# Patient Record
Sex: Female | Born: 1985
Health system: Southern US, Community
[De-identification: ages and names within clinical notes are randomized; demographics above are authoritative.]

## PROBLEM LIST (undated history)

## (undated) DIAGNOSIS — O9902 Anemia complicating childbirth: Secondary | ICD-10-CM

## (undated) DIAGNOSIS — I509 Heart failure, unspecified: Secondary | ICD-10-CM

## (undated) DIAGNOSIS — Z87440 Personal history of urinary (tract) infections: Secondary | ICD-10-CM

## (undated) DIAGNOSIS — O1490 Unspecified pre-eclampsia, unspecified trimester: Secondary | ICD-10-CM

## (undated) DIAGNOSIS — D696 Thrombocytopenia, unspecified: Secondary | ICD-10-CM

## (undated) DIAGNOSIS — B019 Varicella without complication: Secondary | ICD-10-CM

## (undated) HISTORY — DX: Personal history of urinary (tract) infections: Z87.440

## (undated) HISTORY — DX: Varicella without complication: B01.9

---
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
## 2003-09-14 ENCOUNTER — Other Ambulatory Visit: Admission: RE | Admit: 2003-09-14 | Discharge: 2003-09-14 | Payer: Self-pay | Admitting: Obstetrics and Gynecology

## 2005-08-10 ENCOUNTER — Encounter: Admission: RE | Admit: 2005-08-10 | Discharge: 2005-08-10 | Payer: Self-pay | Admitting: Internal Medicine

## 2005-10-22 ENCOUNTER — Ambulatory Visit: Payer: Self-pay | Admitting: Pulmonary Disease

## 2005-10-22 ENCOUNTER — Ambulatory Visit: Admission: RE | Admit: 2005-10-22 | Discharge: 2005-10-22 | Payer: Self-pay | Admitting: Cardiology

## 2007-07-05 LAB — HM HEPATITIS C SCREENING LAB: HM Hepatitis Screen: NEGATIVE

## 2007-07-17 DIAGNOSIS — I509 Heart failure, unspecified: Secondary | ICD-10-CM

## 2007-07-17 DIAGNOSIS — O1490 Unspecified pre-eclampsia, unspecified trimester: Secondary | ICD-10-CM

## 2007-07-17 HISTORY — DX: Unspecified pre-eclampsia, unspecified trimester: O14.90

## 2007-07-17 HISTORY — DX: Heart failure, unspecified: I50.9

## 2008-05-21 ENCOUNTER — Inpatient Hospital Stay (HOSPITAL_COMMUNITY): Admission: AD | Admit: 2008-05-21 | Discharge: 2008-05-21 | Payer: Self-pay | Admitting: Obstetrics and Gynecology

## 2008-05-21 ENCOUNTER — Ambulatory Visit: Payer: Self-pay | Admitting: Hematology & Oncology

## 2008-05-22 ENCOUNTER — Inpatient Hospital Stay (HOSPITAL_COMMUNITY): Admission: AD | Admit: 2008-05-22 | Discharge: 2008-05-22 | Payer: Self-pay | Admitting: Obstetrics and Gynecology

## 2008-05-27 ENCOUNTER — Inpatient Hospital Stay (HOSPITAL_COMMUNITY): Admission: AD | Admit: 2008-05-27 | Discharge: 2008-06-02 | Payer: Self-pay | Admitting: Obstetrics and Gynecology

## 2008-05-27 ENCOUNTER — Ambulatory Visit: Payer: Self-pay | Admitting: Internal Medicine

## 2008-05-29 ENCOUNTER — Encounter: Payer: Self-pay | Admitting: Internal Medicine

## 2008-05-29 ENCOUNTER — Encounter (INDEPENDENT_AMBULATORY_CARE_PROVIDER_SITE_OTHER): Payer: Self-pay | Admitting: Obstetrics and Gynecology

## 2008-05-29 ENCOUNTER — Ambulatory Visit: Payer: Self-pay | Admitting: Hematology & Oncology

## 2008-06-09 ENCOUNTER — Encounter: Admission: RE | Admit: 2008-06-09 | Discharge: 2008-07-08 | Payer: Self-pay | Admitting: Obstetrics and Gynecology

## 2008-07-09 ENCOUNTER — Encounter: Admission: RE | Admit: 2008-07-09 | Discharge: 2008-07-12 | Payer: Self-pay | Admitting: Obstetrics and Gynecology

## 2008-11-18 ENCOUNTER — Ambulatory Visit (HOSPITAL_COMMUNITY): Admission: RE | Admit: 2008-11-18 | Discharge: 2008-11-18 | Payer: Self-pay | Admitting: Obstetrics and Gynecology

## 2009-09-04 IMAGING — CR DG CHEST 2V
2 series · 2 of 2 positions shown · non-contrast
Comparison: Chest CT 05/29/2008

CLINICAL DATA: Preterm labor.  Chest pain.  Pulmonary edema.

CHEST - 2 VIEW

[view not recorded (1 of 2)]
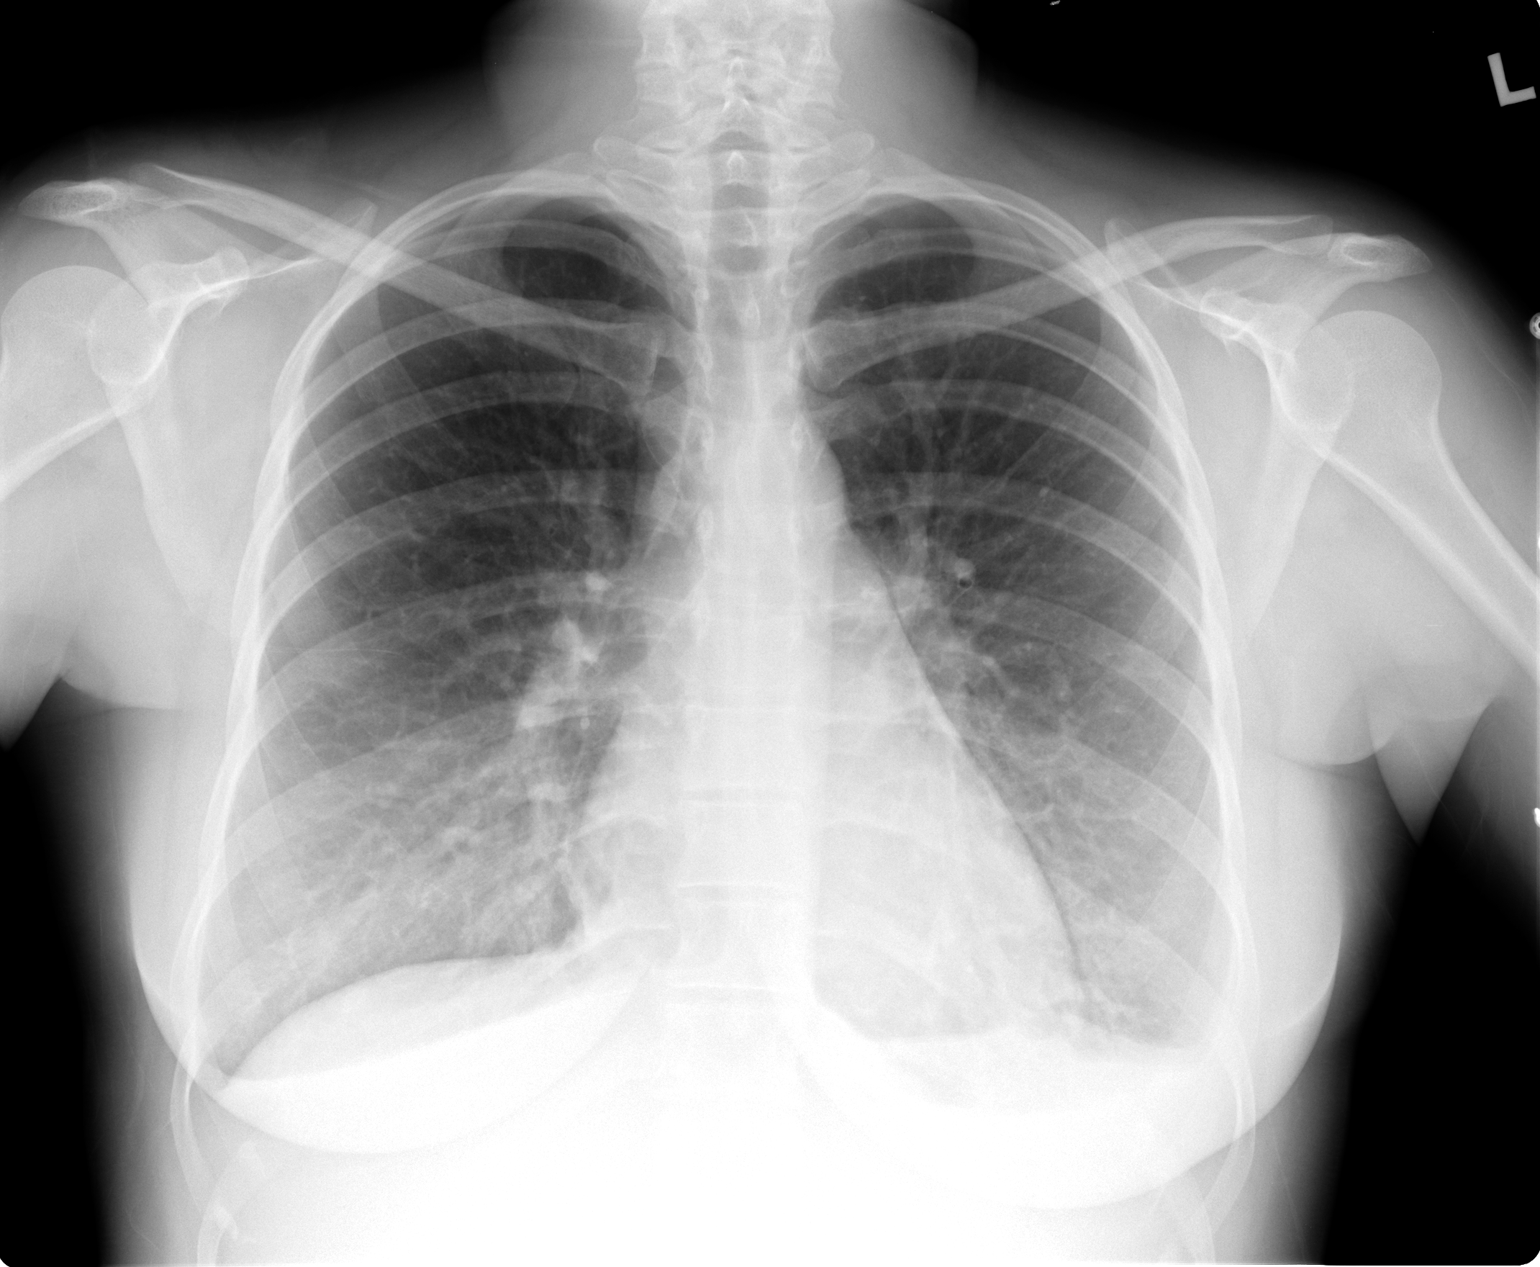

[view not recorded (2 of 2)]
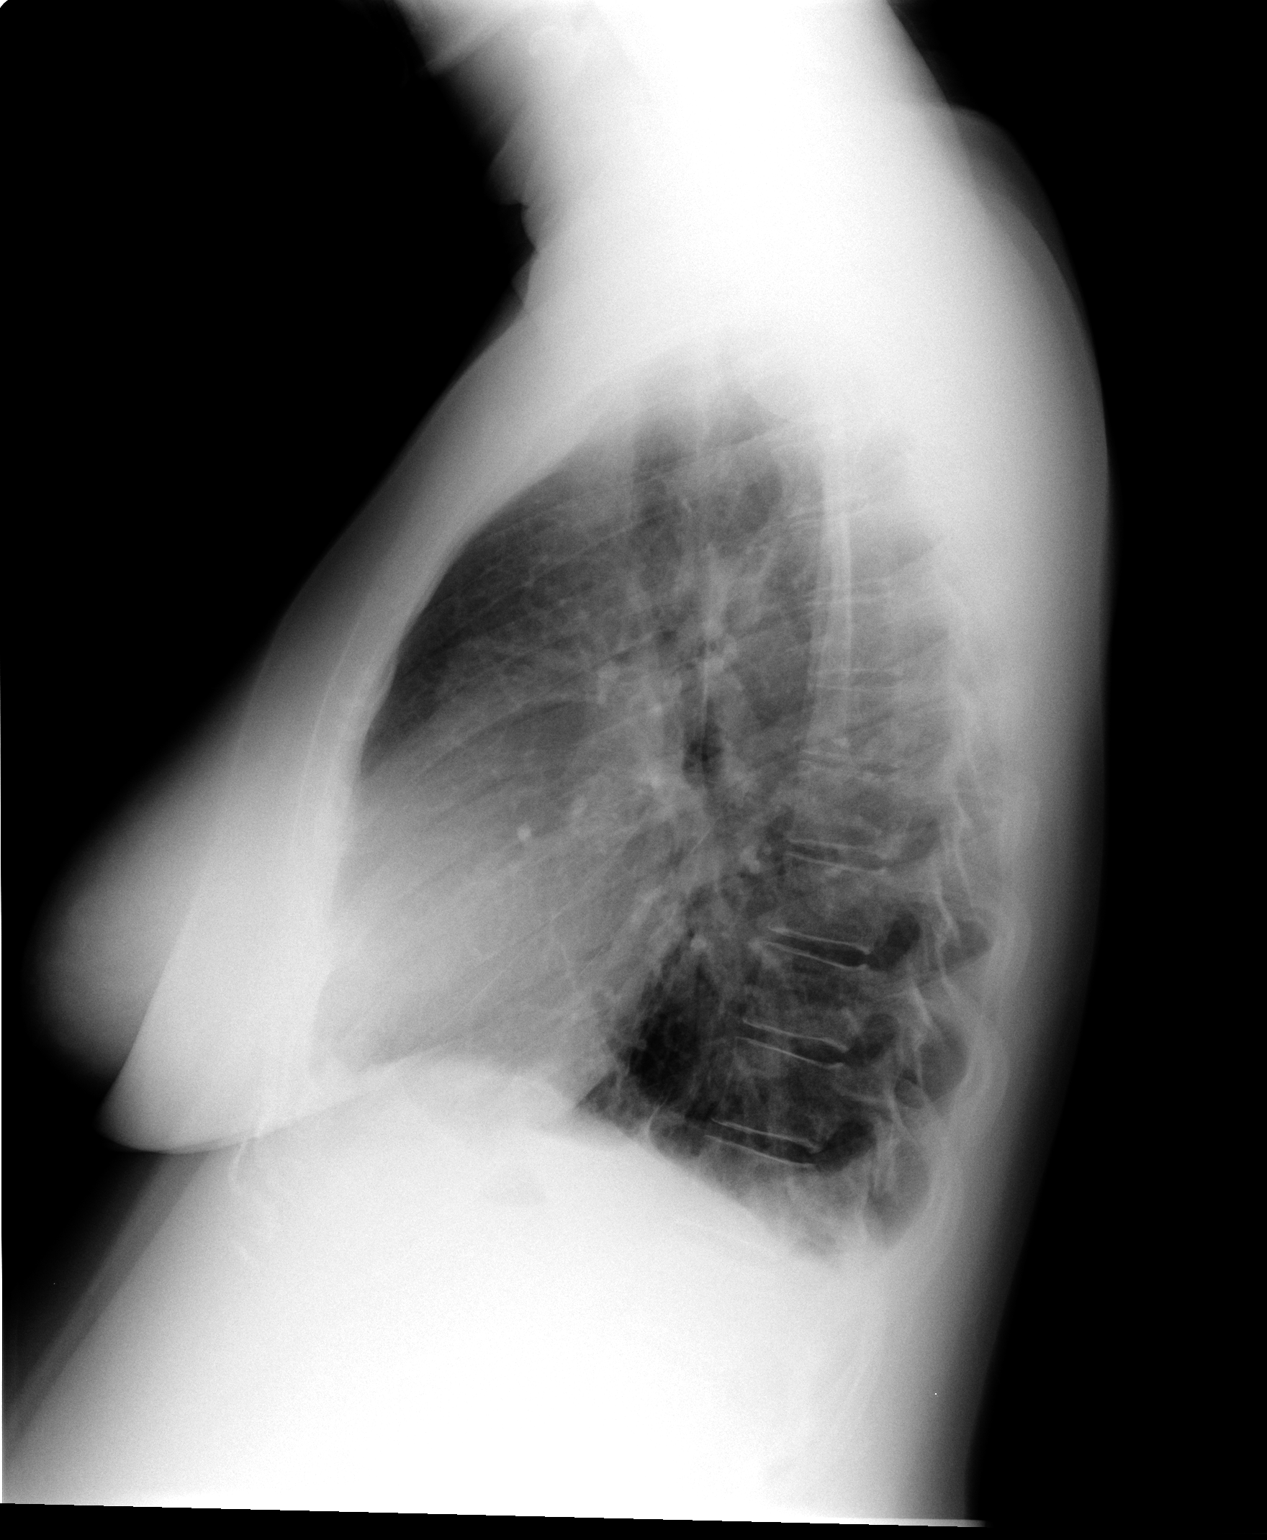

[2 of 2 positions shown; findings below may reference images not displayed]

FINDINGS: Small bilateral pleural effusions are present, left
greater than right.  Bibasilar atelectasis is associated with the
effusions.  Mild bibasilar interstitial pulmonary edema is present.
Compared to the prior exam of 05/29/2008, the left pleural effusion
appears slightly larger however the interstitial edema appears
minimally improved.  No focal airspace consolidation.
Cardiopericardial silhouette within normal limits for projection.
IMPRESSION: 1.  Minimally improved interstitial pulmonary edema.
2.  Small bilateral pleural effusions, left greater than right.

## 2010-09-06 ENCOUNTER — Other Ambulatory Visit: Payer: Self-pay | Admitting: Obstetrics and Gynecology

## 2010-09-21 ENCOUNTER — Other Ambulatory Visit: Payer: Self-pay | Admitting: Obstetrics and Gynecology

## 2010-10-12 ENCOUNTER — Ambulatory Visit: Payer: 59 | Attending: Gynecologic Oncology | Admitting: Gynecologic Oncology

## 2010-10-19 ENCOUNTER — Ambulatory Visit: Payer: 59 | Attending: Gynecologic Oncology | Admitting: Gynecologic Oncology

## 2010-10-19 DIAGNOSIS — D280 Benign neoplasm of vulva: Secondary | ICD-10-CM | POA: Insufficient documentation

## 2010-11-03 NOTE — Consult Note (Signed)
  NAMETERA, PELLICANE              ACCOUNT NO.:  [id number]  MEDICAL RECORD NO.:  [id number]           PATIENT TYPE:  LOCATION:                                 FACILITY:  PHYSICIAN:  Laurette Schimke, MD     DATE OF BIRTH:  1986-03-27  DATE OF CONSULTATION:  10/19/2010 DATE OF DISCHARGE:  10/19/2010                                CONSULTATION   Visit #[id number].  REASON FOR VISIT:  Atypical melanocytic nevi of the genital type.  HISTORY OF PRESENT ILLNESS:  This is a 25 year old gravida 1, para 1, who noted the presence of a vulva lesion at her postpartum visit in November 2011.  She notes identification of pigmented-appearing lesion on her labia.  She denies any pruritus or bleeding or abnormal coloration associated with the lesion.  She underwent a punch biopsy of 2 lesions, one of the right labia minora and a second perineal biopsy. The peroneal biopsy returned with atypical melanocytic proliferation of genital origin.  That biopsy measured 6 x 4 x 3 mm with margin involvement.  This biopsy was obtained on September 21, 2010, and required suture to control bleeding.  PAST MEDICAL HISTORY:  Denies past GYN history, pregnancy complicated by preeclampsia with congestive heart failure.  PAST SURGICAL HISTORY:  Cesarean section in November 2011.  Tobacco use denies, reports approximately 2-3 beers over the weekend. 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  ALLERGIES:  PENICILLIN administered in childhood that caused a rash.  REVIEW OF SYSTEMS:  No nausea, vomiting, fever, chills.  She reports a history of tanning bed use, last use in February 2011.  No flank pain. No adenopathy.  No other worrisome nevi at current.  Otherwise, 10-point review of systems negative.  PHYSICAL EXAMINATION:  GENERAL:  Well-developed, thin female, in no acute distress. CHEST:  Clear to auscultation. LYMPH NODE SURVEY:  No cervical, subclavicular, or inguinal adenopathy. PELVIC:  Normal external  genitalia, Bartholin's, urethra, and Skene's. The biopsy site in the perineum is identifiable only because of nodularity of the suture, no gross pigmented lesions are appreciated.  The diagnosis of genital area atypical genital nevi were discussed with the patient.  The options presented to her given the positive margins or re-excision or careful followup.  The patient opts for re-excision. This procedure, although very small will likely occur in the operating room given the patient's reluctance to have been off this procedure because of the bleeding at the last visit, as such she recontacted regarding a surgical date for re-excision.     Laurette Schimke, MD     WB/MEDQ  D:  10/19/2010  T:  10/20/2010  Job:  [id number]  cc:   Maxie Better, M.D. Fax: [phone]  Telford Nab, R.N. [address] [place], [place] [address]  Electronically Signed by Laurette Schimke MD on 11/03/2010 09:43:33 AM

## 2010-11-28 NOTE — Discharge Summary (Signed)
[name], [name]              ACCOUNT NO.:  [id number]   MEDICAL RECORD NO.:  [id number]          PATIENT TYPE:  INP   LOCATION:  9313                          [facility]   PHYSICIAN:  Maxie Better, M.D.DATE OF BIRTH:  [birth year]   DATE OF ADMISSION:  05/27/2008  DATE OF DISCHARGE:  06/02/2008                               DISCHARGE SUMMARY   ADMISSION DIAGNOSES:  Severe preeclampsia, intrauterine gestation at 31  weeks.   DISCHARGE DIAGNOSES:  Severe preeclampsia, intrauterine gestation at 27  and 1/7 weeks delivered, pulmonary edema.   PROCEDURE:  Primary cesarean section per hysterotomy.   HISTORY OF PRESENT ILLNESS:  A 25 year old gravida 1, para 0 female at  5 weeks admitted with elevated blood pressure and low platelets.  The  patient had an uncomplicated prenatal course until the patient was noted  to have isolated thrombocytopenia.  At this time blood pressure was  normal.  The patient was thought to have gestational thrombocytopenia.  She subsequently had elevation of her fluid in the legs for which PIH  studies were done which were normal.  She also had an episode of severe  right upper quadrant pain for which abdominal ultrasound was negative.  PIH labs again were normal.  Blood pressure remained normal.  The  patient was brought out of work due to peripheral edema and had  resolution of her edema.  Platelet counts remained low.  On May 27, 2008, the patient presented with elevation of her blood pressure and she  was admitted.  The patient received betamethasone after she presented  with her second episode of abnormal complaint.   HOSPITAL COURSE:  The patient was admitted on May 27, 2008, with  blood pressure of 160/110.  She received IV labetalol.  PIH studies were  obtained.  Her spot urine had shown 1+ protein and a 24 hour urine  collection was started.  Her platelet count was down to 82,000.  Uric  acid was elevated at 6.8.  She was  admitted as severe pregnancy induced  hypertension and probable preeclampsia with worsening, tracing was  reactive, no decels, no contractions.  NICU consultation was obtained.  The patient was started on labetalol 200 mg p.o. b.i.d. and repeat PIH  studies were planned.  On May 28, 2008, her hemoglobin was 9.6,  hematocrit 28.1, platelet count of 92,000.  Her liver enzymes were still  normal.  Her uric acid 7.3.  She had 1+ edema and deep tendon reflexes  1+.  Magnesium sulfate had been started on admission.  The patient on  hospital day #3 serum magnesium sulfate that had been started completed,  was complaining of shoulder and neck pain and right arm weakness.  She  had some right upper quadrant pain and soreness but not as bad as her  initial presentation a few days previously.  Some facial edema and hand  swelling.  Her blood pressures ranged 121-131 over 85-94.  Blood  pressure at that time was 130/95.  She was afebrile.  Her lungs were  clear.  There was no wheezing.  The patient reported having  difficulty  lying flat.  Her urine total protein came back as 856 at 5 mg of  protein.  Her platelet count was 88,000, magnesium level pending and she  was 2+ liters.  Given her shortness of breath, neck pain/right upper  quadrant pain the patient had a chest x-ray performed, chest x-ray to  rule out pulmonary edema, acute congestive heart failure was noted.  CT  scan was obtained to rule out pulmonary result and magnesium sulfate was  discontinued.  Hematology/oncology group consult was obtained.  Magnesium level was 6.5 after being discontinued.  CT scan was negative  for any PE.  Borderline cardiomegaly was noted.  Cardiology consultation  and MFM consultation was also obtained.  Echocardiogram was done and was  negative.  Lasix was given.  Maternal fetal medicine input suggested  that this was severe preeclampsia and the patient needed to be  delivered.  Therefore, the patient was  taken to the operating room and  underwent a primary cesarean section per hysterotomy, live female 3  pounds 9 ounces, Apgars of 7 and 7.  Postoperatively she was placed in  intensive care unit.  Magnesium was held due to the elevation of level  and pulmonary edema previously.  Blood pressure medications were  continued.  The patient received several rounds of Lasix.  Hydrochlorothiazide was added to her labetalol regimen.  Blood pressures  were improving on the regimen.  She was 3 liters diuresis.  Repeat chest  x-ray still showed some pulmonary edema but the patient clinically could  lie flat and was feeling much better.  The patient subsequently was able  to be transferred to the postpartum floor.  She was continued on her  hydrochlorothiazide and labetalol and on postoperative day #4 the  patient was able to be discharged home.   DISPOSITION:  Home.   CONDITION:  Stable.   DISCHARGE MEDICATIONS:  1. Labetalol 200 mg p.o. b.i.d.  2. Hydrochlorothiazide 50 mg p.o. daily.  3. Ambien 5 mg p.o. at bedtime.  4. Darvocet-N 100 one tab every 4 hours as needed.   FOLLOWUP:  At [facility] ob/gyn in 1 week for blood pressure evaluation.   DISCHARGE INSTRUCTIONS:  Per the postpartum booklet given and per PIH  warning signs.      Maxie Better, M.D.  Electronically Signed     [place]/MEDQ  D:  07/11/2008  T:  07/11/2008  Job:  [id number]

## 2010-11-28 NOTE — Op Note (Signed)
[name], [name]              ACCOUNT NO.:  [id number]   MEDICAL RECORD NO.:  [id number]          PATIENT TYPE:  INP   LOCATION:  9374                          [facility]   PHYSICIAN:  Maxie Better, M.D.DATE OF BIRTH:  07-Oct-1985   DATE OF PROCEDURE:  05/29/2008  DATE OF DISCHARGE:                               OPERATIVE REPORT   PREOPERATIVE DIAGNOSES:  1. Severe preeclampsia.  2. Pulmonary edema.  3. Intrauterine gestation at 31-1/7 weeks.   POSTOPERATIVE DIAGNOSES:  1. Severe preeclampsia.  2. Pulmonary edema.  3. Intrauterine gestation at 31-1/7 weeks.   PROCEDURE:  Primary cesarean section, Kerr hysterotomy.   ANESTHESIA:  Spinal x2.   SURGEON:  Maxie Better, MD   ASSISTANT:  None.   INDICATION:  A 25 year old gravida 1, para 0 female at 31-1/7 weeks with  betamethasone complete, who was admitted with severe preeclampsia on  May 27, 2008, and developed acute pulmonary edema with a normal  maternal echo being found and an unstable cervix, now being taken to the  operating room for primary cesarean section.  Surgical risks were  reviewed with the patient.  The patient received 40 mg of Lasix prior to  being transferred to the operating room.   PROCEDURE IN DETAIL:  Under adequate spinal anesthesia x2, the patient  was placed in the supine position with a left lateral tilt.  She was  sterilely prepped and draped in the usual fashion.  An indwelling Foley  catheter was already in place.  Slight tinge on the urine was  transiently noted for blood.  Marcaine 0.25% was injected along the  planned Pfannenstiel skin incision site.  Pfannenstiel skin incision was  then made, carried down to the rectus fascia.  Rectus fascia was opened  transversely.  Rectus fascia then was bluntly and sharply dissected off  the rectus muscle in a superior and inferior fashion.  The rectus  muscles were split in the midline.  The parietal peritoneum was entered  sharply and extended.  Vesicouterine peritoneum was opened transversely.  The bladder bluntly dissected off the lower uterine segment.  A  curvilinear low transverse uterine incision was then made and extended  with bandage scissors.  Artificial rupture of membranes were performed.  Clear amniotic fluid was noted.  Placenta was noted to be anterior.  Subsequent delivery of a live female was accomplished.  Baby was bulb  suctioned in the abdomen.  Cord was clamped and cut.  The baby was  transferred to the awaiting pediatrician, who subsequently assigned the  baby Apgars of 7 and 7 at 1 and 5 minutes.  The placenta was manually  removed.  The uterine cavity was cleaned of debris.  Uterus was  exteriorized.  The uterine cavity was cleaned again.  The uterine  incision had no extension and was closed in 2 layers, the first layer  with 0 Monocryl in a running locked stitch and the second layer was  imbricated using a 0 Monocryl suture.  Two additional sutures were  placed along the incision line for good hemostasis.  Normal tubes and  ovaries were noted  bilaterally.  The uterus was returned to the abdomen.  The abdomen  was copiously irrigated, suctioned of debris and clots.  The reinspection of the incision showed good hemostasis.  The parietal  peritoneum was closed with 2-0 Vicryl.  The rectus fascia was again  carefully inspected for any bleeders and cauterization was done if  found.  The rectus fascia was closed with 0 Vicryl x2.  The subcutaneous  areas were irrigated, small bleeders cauterized, and interrupted 2-0  plain sutures were placed in the skin approximately using Ethicon  staples.   SPECIMENS:  Placenta sent to Pathology.   ESTIMATED BLOOD LOSS:  1 L.   URINE OUTPUT:  500 mL.   INTRAOPERATIVE FLUID:  1200 mL.   Sponge and instrument counts x2 was correct.   COMPLICATIONS:  None.   Weight of the baby was 3 pounds and 9 ounces.  The baby was transferred  to the NICU  secondary to prematurity.  The patient tolerated the  procedure well and was transferred to recovery room in stable condition  and will be admitted to the Intensive Care Unit for further management.      Maxie Better, M.D.  Electronically Signed     [place]/MEDQ  D:  05/29/2008  T:  05/29/2008  Job:  [id number]

## 2010-11-28 NOTE — Consult Note (Signed)
[name], [name]              ACCOUNT NO.:  [id number]   MEDICAL RECORD NO.:  [id number]          PATIENT TYPE:  INP   LOCATION:  9374                          [facility]   PHYSICIAN:  Rose Phi. Myna Hidalgo, M.D. DATE OF BIRTH:  [birth year]   DATE OF CONSULTATION:  05/29/2008  DATE OF DISCHARGE:                                 CONSULTATION   REFERRING PHYSICIAN:  Maxie Better, MD   REASON FOR CONSULTATION:  1. Thrombocytopenia.  2. Third trimester pregnancy.  3. Pregnancy-induced hypertension.   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  This is her  first pregnancy.  She has been doing very well until recently.   She was admitted on May 27, 2008, because of PIH.  Her blood  pressure was 160/110.  She was noted to have a platelet count at that  time was 82.   She has had blood work done in the past according to Dr. Cherly Hensen.  Apparently, the platelet count in the past was normal.   She has not had any bleeding.  There has been no rashes.  She had no  bruising.  She does complain of some pain in the right neck.  This  occurred all of a sudden.  There may be some slight shortness of breath.  She denies any obvious chest pain or pleuritic pain.  There has been no  abdominal discomfort outside of that associated with the pregnancy.   There has been no hematuria.  Previously, had no diarrhea or  constipation.  There has been some slight swelling in the legs.   When she was [name] back on the May 21, 2008, platelet count was 112.   On May 27, 2008, her CBC showed a white count of 9.1, hemoglobin  10.5, hematocrit 30.9, and platelet count 82.  MCV was 91.  She had a  normal LDH.  BUN and creatinine were 8 and 0.6.  She had normal LFTs.  Total protein and albumin were decreased.   On the May 28, 2008, platelet count was 92,000 still.  Her  hemoglobin was down just a  little bit at 9.6.  She again had normal LDH  and BUN and creatinine.   Today, a CBC was drawn.  It showed a white count of 7.6, hemoglobin  10.1, hematocrit 30.3, and platelet count 88.  MCV was 91.  Her  metabolic panel shows sodium 137, potassium 3.0, BUN 7, and creatinine  6.  She had a total protein of 5.2 and albumin of 2.2.  She had a normal  LDH.  She had normal uric acid.  She has been on magnesium.   She did have a chest x-ray done today.  This showed acute congestive  heart failure with borderline cardiomegaly.  She also went out for a CT  scan.  The report I do not have back yet now.   She denies any fever.  There has been no headache.  Again she does state  there is some discomfort  in the right side of her neck.  This seems to  go down her right arm.  She has just some slight weakness in her right  hand.   Her past medical history is relatively unremarkable.   Her allergies are to PENICILLIN.   ADMISSION MEDICATIONS:  Prenatal vitamins.  She currently is on Zantac  150 mg p.o. b.i.d.   SOCIAL HISTORY:  Negative for tobacco or alcohol use.  She has no  obvious occupational exposures.   Her review of systems is stated in the history of present illness.  No  obvious changes were noted on a 12-system review.   PHYSICAL EXAMINATION:  GENERAL:  This is a well-developed, well-  nourished white female who is pregnant.  She is alert and oriented x3.  VITAL SIGNS:  Her temperature of 98, pulse is 78, respiratory rate 18.  I do not have her blood pressure.  HEAD/NECK:  Normocephalic and atraumatic skull.  Pupils react  appropriately.  She has no oral lesions.  She has no adenopathy in her  neck.  Thyroid is not palpable.  LUNGS:  Clear bilaterally.  There are no rales, wheezes, or rhonchi.  CARDIAC:  Regular rate and rhythm with a normal S1 and S2.  There are no  murmurs, rubs, or bruits.  ABDOMEN:  Soft with good bowel sounds.  There is no palpable abdominal  mass.  She  is pregnant.  I do not detect any palpable  hepatosplenomegaly.  BACK:  No tenderness of the spine, ribs, or hips.  EXTREMITIES:  Some trace nonpitting edema in the lower legs.  She has  good range of motion of her joints.  She has some very slight decrease  in right arm strength and grip.  Her right shoulder strength is adequate  and symmetric to her left shoulder.  SKIN:  No rashes, ecchymoses, or petechiae.  NEUROLOGIC:  No focal neurological deficits outside of the slight right  hand/arm weakness.   Her peripheral blood smear shows a normochromic normocytic population of  red blood cells.  There are no nucleated red blood cells.  I see no  teardrop cells.  She had no rouleaux formation.  I see no schistocytes.  White cells appear normal in morphology and  maturation.  There are no  immature myeloid or lymphoid forms.  Platelets are minimally decreased  in number.  She has numerous large platelets.  Platelets are well  granulated.   [name] is a charming 25 year old white female with thrombocytopenia.  This is very mild thrombocytopenia.  She is in third trimester  pregnancy.  She now has PIH.   One would have to believe that the platelet count is correlating with  the PIH.  This certainly appears to be gestational thrombocytopenia.  One would have to suspect that the placenta is acting as a sponge and  sequestering her platelets.   I certainly do not see any obvious hematologic malignancy.  I do not see  any evidence to suggest a microangiopathic hemolytic process such as TTP  or HUS.  I do not see any evidence to suggest ITP.  One would think ITP  would come on quite earlier in pregnancy and also her platelet count  would be a lot lower.  There is nothing to suggest B12 deficiency.  I do  not suspect any type of collagen vascular disease.   One would think that lupus anticoagulant or anticardiolipin antibody  would be a little unlikely causing such issues at this  stage of  the  pregnancy, but this certainly cannot be totally overlooked.   I believe that [name] has a minimally increased risk of bleeding  with her current platelet count.  Her large platelets are however very  functional and sticky, so I believe that hemostasis should not pose an  issue if she has go to a surgery or if she has a natural delivery.   I certainly do not see need for a bone marrow biopsy with [name]  right now.   I think that the only option to improve her platelet count is to have  her deliver.  I believe that an epidural would be safe, if her platelet  count got down to 70,000.   I would not transfuse her unless she is bleeding.   I do not see need for any IVIG or steroids for [name] as I do not  believe that this is an autoimmune thrombocytopenia process.   It would be interesting to see what her chest CT shows.  I would not  suspect pulmonary embolism, but one could postulate this.   As far as the right hand/arm weakness, this is mild.  It is hard to say  if this is a new problem.  She certainly is not too encumbered by it.  I  certainly would recommend close followup with the right hand and arm.  It is possible that she may need to have an MRI of the brain and make  sure that there is no cerebrovascular event going on.   I will certainly follow along and help out in anyway that I can.      Rose Phi. Myna Hidalgo, M.D.  Electronically Signed     PRE/MEDQ  D:  05/29/2008  T:  05/30/2008  Job:  [id number]   cc:   Maxie Better, M.D.  Fax: [phone]

## 2010-11-28 NOTE — Consult Note (Signed)
NAMEMACEY, WURTZ              ACCOUNT NO.:  [id number]   MEDICAL RECORD NO.:  [id number]          PATIENT TYPE:  INP   LOCATION:  9196                          [facility]   PHYSICIAN:  Pricilla Riffle, MD, FACCDATE OF BIRTH:  1986/02/01   DATE OF CONSULTATION:  05/29/2008  DATE OF DISCHARGE:                                 CONSULTATION   REASON FOR CONSULTATION:  Ms. Rogelio Seen is a 25 year old who we are asked  to see regarding CHF.   HISTORY OF PRESENT ILLNESS:  The patient has no known history of cardiac  problems.  She is now [redacted] weeks gestation.  About a week ago she noted  lower extremity swelling and also developed hypertension, decreased  platelets.  On Monday of this week she noted some shortness of breath  and on Thursday developed right shoulder pain, right arm pain/weakness.  She was admitted on Thursday evening with headache, shoulder pain  concern for preeclampsia/eclampsia.  She was treated with labetalol and  IV magnesium.  Today noted increased shortness of breath, chest x-ray  consistent with CHF.  Currently without a headache, notes some shortness  of breath.  Note, she just received 40 of Lasix intravenously   ALLERGIES:  PENICILLIN.   PAST MEDICAL HISTORY:  Negative.  Note, she did have a cardiopulmonary  stress test in May 2007 that showed mild impairment consistent with  deconditioning probably.   FAMILY HISTORY:  No history of premature CAD or CHF.   SOCIAL HISTORY:  The patient works on [address] at [employer].  Does  not smoke.   MEDICATIONS:  1. Currently include Zantac 150 b.i.d.  2. Lasix 140 mg.  3. Labetalol 200 b.i.d.  4. Magnesium discontinued earlier.  5. Niferex.   REVIEW OF SYSTEMS:  Nonproductive cough, otherwise all systems reviewed  as to the above problem except as noted above.   PHYSICAL EXAM:  GENERAL:  On exam the patient is in no acute distress at  rest.  VITAL SIGNS:  Blood pressure is 148/103, pulse is 90s, respiratory  rate  is 18, temperature is 97.9.  HEENT:  Normocephalic, atraumatic.  EOMI.  PERRL.  Mucous membranes are  moist.  NECK:  Increased JVP.  No thyromegaly or bruits.  LUNGS:  Rales 1/3 up bilaterally.  CARDIAC:  Regular rate and rhythm.  S1-S2.  No S3-S4 or murmurs.  ABDOMEN:  Abdomen is distended, mild right upper quadrant tenderness.  EXTREMITIES:  Warm, 2+ pulses distally.  Trace lower extremity edema.   LABS:  Significant for a hemoglobin of 10, WBC of 7.6, platelets of  88,000, BUN and creatinine of 7 and 0.6, potassium of 3.8.  AST, ALT of  19 and 12.  Chest x-ray again consistent with pulmonary edema.  CT of  the chest shows no pulmonary embolus, CHF, noncalcified granulomata  noted, minimal patchy pneumonia versus focal inflammation.   Echocardiogram preliminary shows normal LV RV size, normal LV function  with an LVEF of 60-65%, trace MR, trace TR.   IMPRESSION:  The patient is a 25 year old G1, P0 now [redacted] weeks pregnant.  She  is hypertensive, short of breath during presentation consistent with  pulmonary edema responding well to IV Lasix with brisk diuresis.  Echo  with normal LV RV function.  Overall above is consistent with  preeclampsia.  No plans for C-section as noted.  From a cardiac  standpoint she should tolerate well.  Can dose Lasix as needed by  clinical status.  Continue labetalol.  Will continue to follow.      Pricilla Riffle, MD, [facility]  Electronically Signed     [place]/MEDQ  D:  05/29/2008  T:  05/29/2008  Job:  [id number]

## 2010-12-04 ENCOUNTER — Other Ambulatory Visit: Payer: Self-pay | Admitting: Gynecologic Oncology

## 2010-12-04 ENCOUNTER — Encounter (HOSPITAL_COMMUNITY): Payer: 59

## 2010-12-04 LAB — SURGICAL PCR SCREEN: Staphylococcus aureus: NEGATIVE

## 2010-12-04 LAB — CBC
MCH: 28.2 pg (ref 26.0–34.0)
MCV: 87 fL (ref 78.0–100.0)
Platelets: 152 10*3/uL (ref 150–400)
RBC: 4.61 MIL/uL (ref 3.87–5.11)
RDW: 13.1 % (ref 11.5–15.5)

## 2010-12-05 ENCOUNTER — Other Ambulatory Visit: Payer: Self-pay | Admitting: Gynecologic Oncology

## 2010-12-05 ENCOUNTER — Ambulatory Visit (HOSPITAL_COMMUNITY)
Admission: RE | Admit: 2010-12-05 | Discharge: 2010-12-05 | Disposition: A | Discharge status: Home / Self Care | Payer: 59 | Source: Ambulatory Visit | Attending: Gynecologic Oncology | Admitting: Gynecologic Oncology

## 2010-12-05 DIAGNOSIS — Z01812 Encounter for preprocedural laboratory examination: Secondary | ICD-10-CM | POA: Insufficient documentation

## 2010-12-05 DIAGNOSIS — D280 Benign neoplasm of vulva: Secondary | ICD-10-CM | POA: Insufficient documentation

## 2010-12-18 NOTE — Op Note (Signed)
  [name], [name]              ACCOUNT NO.:  [id number]  MEDICAL RECORD NO.:  [id number]           PATIENT TYPE:  O  LOCATION:  DAYL                         FACILITY:  [facility]  PHYSICIAN:  Laurette Schimke, MD     DATE OF BIRTH:  09/04/1985  DATE OF PROCEDURE:  12/05/2010 DATE OF DISCHARGE:  12/05/2010                              OPERATIVE REPORT   PREOPERATIVE DIAGNOSIS:  Atypical melanocytic nevus of the vulva.  POSTOPERATIVE DIAGNOSIS:  Atypical melanocytic nevus of the vulva.  PROCEDURE:  Wide local excision 1 cm x 1 cm with the perineal block anesthesia, LMA with 1% lidocaine perineal block.  SURGEON:  Laurette Schimke, MD  ASSISTANT:  Roseanna Rainbow, M.D.  INDICATIONS FOR PROCEDURE:  This is a 25 year old with a lesion noted on the right aspect of the perineum, it was excised, and was noted to be an atypical melanocytic nevus with positive margins.  The patient was counseled regarding observation versus re-excision for negative margin and opted for re-excision.  DESCRIPTION OF PROCEDURE:  She was taken to the operating room and placed under anesthesia without any difficulty.  She was prepped and draped in usual sterile fashion.  The area of the prior resection was identifiable based on suture granuloma.  The perineal block was administered bilaterally with 1% lidocaine without epinephrine and 10 cc was injected on each side.  A 1 x 1  cm lesion was excised.  The defect was closed in 2 layers, the first approximated the subcutaneous tissue and the second interrupted 3-0 Vicryl sutures.  Hemostasis was ensured. Estimated blood loss minimal.  Sponge, instrument, and needle counts correct x3.     Laurette Schimke, MD     WB/MEDQ  D:  12/14/2010  T:  12/15/2010  Job:  [id number]  cc:   Telford Nab, R.N. [address] [place], [place] [address]  Electronically Signed by Laurette Schimke MD on 12/18/2010 09:54:10 AM

## 2010-12-21 ENCOUNTER — Ambulatory Visit: Payer: 59 | Attending: Gynecologic Oncology | Admitting: Gynecologic Oncology

## 2010-12-22 NOTE — Consult Note (Signed)
  NAMESAMENTHA, [name] NO.:  [id number]  MEDICAL RECORD NO.:  [id number]  LOCATION:  GYN                          FACILITY:  [facility]  PHYSICIAN:  Laurette Schimke, MD     DATE OF BIRTH:  1985/10/13  DATE OF CONSULTATION:  12/21/2010 DATE OF DISCHARGE:                                CONSULTATION   REASON FOR VISIT:  Wound check.  HISTORY OF PRESENT ILLNESS:  This is a 25 year old who noted a vulva lesion at time of her postpartum visit in November 2002.  Punch biopsy of a perineal lesion was notable for atypical melanocytic proliferation of genital origin with margin involvement.  On Dec 06, 2010, she underwent excision of the area and pathology was notable for residual atypical melanocytic proliferation of genital origin and margins were not involved.  The incision was closed in layers and the patient presented to the office on 2 occasions because of painful external suture. The external sutures were removed and she reports excellent comfort since.  REVIEW OF SYSTEMS:  No fever, chills, nausea, vomiting.  Wound edge separation of perineal excision.  PHYSICAL EXAMINATION:  GENERAL:  A well-developed female, in no acute distress. VITAL SIGNS:  Weight 126 pounds, blood pressure 118/64, pulse of 80. PELVIC:  Normal external genitalia, Bartholin's, urethra, and Skene's. Incision site with good granulation tissues.  Three interrupted sutures were appreciated.  These were removed.  Silver nitrate was placed to achieve hemostasis  IMPRESSION:  Atypical melanocytic proliferation of genital origin, status post wide local excision with negative margins.  [name] has been advised to continue excellent wound care.  She has been counseled regarding keeping the area clean.  I have asked her to follow up in 6 weeks.     Laurette Schimke, MD     WB/MEDQ  D:  12/21/2010  T:  12/22/2010  Job:  [id number]  cc:   Telford Nab, R.N. [address] [place], [place]  [address]  Maxie Better, M.D. Fax: [phone]  Electronically Signed by Laurette Schimke MD on 12/22/2010 10:34:03 AM

## 2011-03-01 ENCOUNTER — Ambulatory Visit: Payer: 59 | Attending: Gynecologic Oncology | Admitting: Gynecologic Oncology

## 2011-03-01 DIAGNOSIS — N9089 Other specified noninflammatory disorders of vulva and perineum: Secondary | ICD-10-CM | POA: Insufficient documentation

## 2011-03-06 NOTE — Consult Note (Signed)
  [name], [name] NO.:  [id number]  MEDICAL RECORD NO.:  [id number]  LOCATION:  GYN                          FACILITY:  [facility]  PHYSICIAN:  Laurette Schimke, MD     DATE OF BIRTH:  [birth year]  DATE OF CONSULTATION:  03/01/2011 DATE OF DISCHARGE:                                CONSULTATION   REASON FOR VISIT:  Atypical melanocytic proliferation of genital origin.  HISTORY OF PRESENT ILLNESS:  This is a 25 year old who was seen by Dr. Cherly Hensen and evaluated for an atypical-appearing vulvar lesion.  This was done at postpartum period in November 2012.  The punch biopsy was notable for atypical melanocytic proliferation of genital origin with margin involvement.  On evaluation after the biopsy, no gross residual disease was present.  The patient was given the options of surveillance versus re-excision in order to obtain negative margins.  She opted for the latter.  On Dec 06, 2010, she underwent an excision of the left inferior labia minora.  Final pathology was notable for residual atypical genital nevus with evidence of chronic inflammation and margins were unclear.  Postoperatively, there was skin separation and pain management.  These have all since resolved and the patient states she is doing very well.  REVIEW OF SYSTEMS:  No fever, chills, nausea, vomiting.  No pain.  No dyspareunia.  PHYSICAL EXAMINATION:  GENERAL:  Well-developed female, in no acute distress. VITAL SIGNS:  Weight 123 pounds, blood pressure 110/68, pulse of 88. PELVIC:  External genitalia with evidence of a scar in the inferior aspect of the right labia, otherwise no lesions appreciated.  IMPRESSION:  Atypical melanocytic proliferation of genital origin.  She is now status post wide local excision with negative margins.  I have asked [name] to follow up with Dr. Cherly Hensen.     Laurette Schimke, MD     WB/MEDQ  D:  03/01/2011  T:  03/02/2011  Job:  [id number]  cc:   Maxie Better, M.D. Fax: [phone]  Telford Nab, R.N. [address] [place], [place] [address]  Electronically Signed by Laurette Schimke MD on 03/06/2011 07:09:04 AM

## 2011-04-16 LAB — OB RESULTS CONSOLE ABO/RH: RH Type: POSITIVE

## 2011-04-16 LAB — OB RESULTS CONSOLE HIV ANTIBODY (ROUTINE TESTING): HIV: NONREACTIVE

## 2011-04-16 LAB — OB RESULTS CONSOLE ANTIBODY SCREEN: Antibody Screen: NEGATIVE

## 2011-04-16 LAB — OB RESULTS CONSOLE RUBELLA ANTIBODY, IGM: Rubella: IMMUNE

## 2011-04-17 LAB — COMPREHENSIVE METABOLIC PANEL
ALT: 10
ALT: 11
ALT: 11
ALT: 12
ALT: 9
AST: 17
AST: 18
AST: 19
AST: 19
AST: 20
AST: 27
Albumin: 2 — ABNORMAL LOW
Albumin: 2 — ABNORMAL LOW
Albumin: 2.1 — ABNORMAL LOW
Alkaline Phosphatase: 125 — ABNORMAL HIGH
Alkaline Phosphatase: 127 — ABNORMAL HIGH
Alkaline Phosphatase: 133 — ABNORMAL HIGH
Alkaline Phosphatase: 136 — ABNORMAL HIGH
Alkaline Phosphatase: 148 — ABNORMAL HIGH
BUN: 10
BUN: 8
CO2: 24
CO2: 24
CO2: 24
CO2: 27
CO2: 28
CO2: 29
Calcium: 6.8 — ABNORMAL LOW
Calcium: 7.3 — ABNORMAL LOW
Calcium: 7.5 — ABNORMAL LOW
Calcium: 7.8 — ABNORMAL LOW
Calcium: 8.3 — ABNORMAL LOW
Calcium: 8.3 — ABNORMAL LOW
Chloride: 100
Chloride: 103
Chloride: 104
Chloride: 106
Chloride: 106
Chloride: 107
Chloride: 107
Creatinine, Ser: 0.58
Creatinine, Ser: 0.65
Creatinine, Ser: 0.82
Creatinine, Ser: 1
GFR calc Af Amer: >60
GFR calc Af Amer: >60
GFR calc Af Amer: >60
GFR calc non Af Amer: >60
GFR calc non Af Amer: >60
GFR calc non Af Amer: >60
GFR calc non Af Amer: >60
GFR calc non Af Amer: >60
Glucose, Bld: 102 — ABNORMAL HIGH
Glucose, Bld: 102 — ABNORMAL HIGH
Glucose, Bld: 76
Glucose, Bld: 80
Glucose, Bld: 82
Glucose, Bld: 94
Potassium: 3.5
Potassium: 3.6
Potassium: 3.9
Potassium: 4.3
Sodium: 134 — ABNORMAL LOW
Sodium: 134 — ABNORMAL LOW
Sodium: 135
Sodium: 138
Total Bilirubin: 0.1 — ABNORMAL LOW
Total Bilirubin: 0.1 — ABNORMAL LOW
Total Bilirubin: 0.2 — ABNORMAL LOW
Total Bilirubin: 0.3
Total Bilirubin: 0.3
Total Protein: 4.6 — ABNORMAL LOW
Total Protein: 4.8 — ABNORMAL LOW
Total Protein: 4.8 — ABNORMAL LOW

## 2011-04-17 LAB — CBC
HCT: 26.3 — ABNORMAL LOW
HCT: 26.6 — ABNORMAL LOW
HCT: 30.3 — ABNORMAL LOW
HCT: 30.9 — ABNORMAL LOW
Hemoglobin: 10.1 — ABNORMAL LOW
Hemoglobin: 9 — ABNORMAL LOW
Hemoglobin: 9.1 — ABNORMAL LOW
Hemoglobin: 9.2 — ABNORMAL LOW
Hemoglobin: 9.6 — ABNORMAL LOW
MCHC: 33.5
MCHC: 33.8
MCHC: 33.9
MCHC: 34.1
MCHC: 34.3
MCHC: 34.4
MCV: 90.9
MCV: 91
MCV: 91
MCV: 91.2
MCV: 91.5
Platelets: 107 — ABNORMAL LOW
Platelets: 110 — ABNORMAL LOW
Platelets: 112 — ABNORMAL LOW
Platelets: 153
Platelets: 92 — ABNORMAL LOW
RBC: 2.89 — ABNORMAL LOW
RBC: 2.9 — ABNORMAL LOW
RBC: 3.05 — ABNORMAL LOW
RBC: 3.31 — ABNORMAL LOW
RDW: 12.3
RDW: 12.5
RDW: 12.6
RDW: 12.6
RDW: 13
WBC: 10.1
WBC: 7.6
WBC: 8.8
WBC: 9.1
WBC: 9.2
WBC: 9.9

## 2011-04-17 LAB — DIFFERENTIAL
Lymphocytes Relative: 25
Lymphs Abs: 2.4
Neutro Abs: 6.6
Neutrophils Relative %: 69

## 2011-04-17 LAB — MAGNESIUM
Magnesium: 2.3
Magnesium: 2.8 — ABNORMAL HIGH

## 2011-04-17 LAB — URINALYSIS, ROUTINE W REFLEX MICROSCOPIC
Glucose, UA: NEGATIVE
Hgb urine dipstick: NEGATIVE
Hgb urine dipstick: NEGATIVE
Ketones, ur: NEGATIVE
Nitrite: NEGATIVE
Protein, ur: 100 — AB
Protein, ur: NEGATIVE
Specific Gravity, Urine: 1.01
Specific Gravity, Urine: 1.015
Urobilinogen, UA: 0.2
Urobilinogen, UA: 0.2
pH: 6.5

## 2011-04-17 LAB — BETA-2-GLYCOPROTEIN I ABS, IGG/M/A
Beta-2 Glyco I IgG: <4 U/mL (ref <20)
Beta-2-Glycoprotein I IgA: 4 U/mL (ref <10)
Beta-2-Glycoprotein I IgM: <4 U/mL (ref <10)

## 2011-04-17 LAB — URIC ACID
Uric Acid, Serum: 6.8
Uric Acid, Serum: 6.8
Uric Acid, Serum: 7.8 — ABNORMAL HIGH

## 2011-04-17 LAB — LUPUS ANTICOAGULANT PANEL
DRVVT: 44.1 (ref 36.1–47.0)
Lupus Anticoagulant: NOT DETECTED
PTT Lupus Anticoagulant: 45.8 (ref 36.3–48.8)

## 2011-04-17 LAB — LACTATE DEHYDROGENASE
LDH: 132
LDH: 167
LDH: 239
LDH: 260 — ABNORMAL HIGH
LDH: 275 — ABNORMAL HIGH

## 2011-04-17 LAB — RPR: RPR Ser Ql: NONREACTIVE

## 2011-04-17 LAB — URINE MICROSCOPIC-ADD ON
RBC / HPF: NONE SEEN
WBC, UA: NONE SEEN

## 2011-04-17 LAB — ABO/RH: ABO/RH(D): O POS

## 2011-04-17 LAB — TYPE AND SCREEN: ABO/RH(D): O POS

## 2011-04-17 LAB — PROTEIN, URINE, 24 HOUR: Urine Total Volume-UPROT: 2275

## 2011-04-17 LAB — STREP B DNA PROBE

## 2011-04-17 LAB — CARDIOLIPIN ANTIBODIES, IGG, IGM, IGA
Anticardiolipin IgA: <7 — ABNORMAL LOW (ref <13)
Anticardiolipin IgM: <7 — ABNORMAL LOW (ref <10)

## 2011-04-17 LAB — CREATININE CLEARANCE, URINE, 24 HOUR
Creatinine, 24H Ur: 1330
Urine Total Volume-CRCL: 2275

## 2011-08-09 ENCOUNTER — Ambulatory Visit
Admission: RE | Admit: 2011-08-09 | Discharge: 2011-08-09 | Disposition: A | Discharge status: Home / Self Care | Payer: 59 | Source: Ambulatory Visit | Attending: Obstetrics and Gynecology | Admitting: Obstetrics and Gynecology

## 2011-08-09 ENCOUNTER — Other Ambulatory Visit: Payer: Self-pay | Admitting: Obstetrics and Gynecology

## 2011-08-09 DIAGNOSIS — M549 Dorsalgia, unspecified: Secondary | ICD-10-CM

## 2011-11-22 ENCOUNTER — Encounter (HOSPITAL_COMMUNITY): Payer: Self-pay | Admitting: *Deleted

## 2011-11-22 ENCOUNTER — Encounter (HOSPITAL_COMMUNITY): Payer: Self-pay

## 2011-11-23 ENCOUNTER — Other Ambulatory Visit: Payer: Self-pay | Admitting: Obstetrics and Gynecology

## 2011-11-24 MED ORDER — DEXTROSE 5 % IV SOLN
Freq: Once | INTRAVENOUS | Status: AC
  Administered 2011-11-26: 100 mL via INTRAVENOUS

## 2011-11-26 ENCOUNTER — Encounter (HOSPITAL_COMMUNITY): Payer: Self-pay | Admitting: Anesthesiology

## 2011-11-26 ENCOUNTER — Inpatient Hospital Stay (HOSPITAL_COMMUNITY): Payer: 59 | Admitting: Anesthesiology

## 2011-11-26 ENCOUNTER — Encounter (HOSPITAL_COMMUNITY): Payer: Self-pay | Admitting: *Deleted

## 2011-11-26 ENCOUNTER — Encounter (HOSPITAL_COMMUNITY)
Admission: RE | Disposition: A | Discharge status: Home / Self Care | Payer: Self-pay | Source: Ambulatory Visit | Attending: Obstetrics and Gynecology

## 2011-11-26 ENCOUNTER — Inpatient Hospital Stay (HOSPITAL_COMMUNITY)
Admission: RE | Admit: 2011-11-26 | Discharge: 2011-11-28 | DRG: 765 | Disposition: A | Discharge status: Home / Self Care | Payer: 59 | Source: Ambulatory Visit | Attending: Obstetrics and Gynecology | Admitting: Obstetrics and Gynecology

## 2011-11-26 DIAGNOSIS — D649 Anemia, unspecified: Secondary | ICD-10-CM | POA: Diagnosis not present

## 2011-11-26 DIAGNOSIS — D689 Coagulation defect, unspecified: Secondary | ICD-10-CM | POA: Diagnosis present

## 2011-11-26 DIAGNOSIS — O34219 Maternal care for unspecified type scar from previous cesarean delivery: Principal | ICD-10-CM | POA: Diagnosis present

## 2011-11-26 DIAGNOSIS — O99892 Other specified diseases and conditions complicating childbirth: Secondary | ICD-10-CM | POA: Diagnosis present

## 2011-11-26 DIAGNOSIS — O9902 Anemia complicating childbirth: Secondary | ICD-10-CM | POA: Diagnosis present

## 2011-11-26 DIAGNOSIS — Z22330 Carrier of Group B streptococcus: Secondary | ICD-10-CM

## 2011-11-26 DIAGNOSIS — O9903 Anemia complicating the puerperium: Secondary | ICD-10-CM | POA: Diagnosis not present

## 2011-11-26 DIAGNOSIS — D696 Thrombocytopenia, unspecified: Secondary | ICD-10-CM | POA: Diagnosis present

## 2011-11-26 HISTORY — DX: Thrombocytopenia, unspecified: D69.6

## 2011-11-26 HISTORY — DX: Heart failure, unspecified: I50.9

## 2011-11-26 HISTORY — DX: Unspecified pre-eclampsia, unspecified trimester: O14.90

## 2011-11-26 HISTORY — DX: Anemia complicating childbirth: O99.02

## 2011-11-26 LAB — SURGICAL PCR SCREEN
MRSA, PCR: NEGATIVE
Staphylococcus aureus: NEGATIVE

## 2011-11-26 LAB — CBC
MCV: 83 fL (ref 78.0–100.0)
Platelets: 129 10*3/uL — ABNORMAL LOW (ref 150–400)
RBC: 4.17 MIL/uL (ref 3.87–5.11)
RDW: 13.8 % (ref 11.5–15.5)
WBC: 8.5 10*3/uL (ref 4.0–10.5)

## 2011-11-26 LAB — RPR: RPR Ser Ql: NONREACTIVE

## 2011-11-26 SURGERY — Surgical Case
Anesthesia: Spinal | Site: Abdomen | Wound class: Clean Contaminated

## 2011-11-26 MED ORDER — SIMETHICONE 80 MG PO CHEW
80.0000 mg | CHEWABLE_TABLET | Freq: Three times a day (TID) | ORAL | Status: DC
  Administered 2011-11-26 – 2011-11-28 (×8): 80 mg via ORAL

## 2011-11-26 MED ORDER — LACTATED RINGERS IV SOLN
INTRAVENOUS | Status: DC
  Administered 2011-11-26 (×4): via INTRAVENOUS

## 2011-11-26 MED ORDER — FLEET ENEMA 7-19 GM/118ML RE ENEM: 1.0000 | ENEMA | Freq: Every day | RECTAL | Status: DC | PRN

## 2011-11-26 MED ORDER — SENNOSIDES-DOCUSATE SODIUM 8.6-50 MG PO TABS
2.0000 | ORAL_TABLET | Freq: Every day | ORAL | Status: DC
  Administered 2011-11-26 – 2011-11-27 (×2): 2 via ORAL

## 2011-11-26 MED ORDER — BUPIVACAINE IN DEXTROSE 0.75-8.25 % IT SOLN
INTRATHECAL | Status: DC | PRN
  Administered 2011-11-26: 12 mg via INTRATHECAL

## 2011-11-26 MED ORDER — PHENYLEPHRINE 40 MCG/ML (10ML) SYRINGE FOR IV PUSH (FOR BLOOD PRESSURE SUPPORT)
PREFILLED_SYRINGE | INTRAVENOUS | Status: AC
  Filled 2011-11-26: qty 5

## 2011-11-26 MED ORDER — BUPIVACAINE HCL (PF) 0.25 % IJ SOLN
INTRAMUSCULAR | Status: DC | PRN
  Administered 2011-11-26: 30 mL

## 2011-11-26 MED ORDER — NALBUPHINE HCL 10 MG/ML IJ SOLN
5.0000 mg | INTRAMUSCULAR | Status: DC | PRN
  Filled 2011-11-26: qty 1

## 2011-11-26 MED ORDER — SODIUM CHLORIDE 0.9 % IJ SOLN: 3.0000 mL | INTRAMUSCULAR | Status: DC | PRN

## 2011-11-26 MED ORDER — NALOXONE HCL 0.4 MG/ML IJ SOLN: 0.4000 mg | INTRAMUSCULAR | Status: DC | PRN

## 2011-11-26 MED ORDER — CEFAZOLIN SODIUM 1-5 GM-% IV SOLN
INTRAVENOUS | Status: AC
  Filled 2011-11-26: qty 50

## 2011-11-26 MED ORDER — MEPERIDINE HCL 25 MG/ML IJ SOLN: 6.2500 mg | INTRAMUSCULAR | Status: DC | PRN

## 2011-11-26 MED ORDER — ONDANSETRON HCL 4 MG/2ML IJ SOLN
INTRAMUSCULAR | Status: DC | PRN
  Administered 2011-11-26: 4 mg via INTRAVENOUS

## 2011-11-26 MED ORDER — DIBUCAINE 1 % RE OINT: 1.0000 "application " | TOPICAL_OINTMENT | RECTAL | Status: DC | PRN

## 2011-11-26 MED ORDER — MORPHINE SULFATE (PF) 0.5 MG/ML IJ SOLN
INTRAMUSCULAR | Status: DC | PRN
  Administered 2011-11-26: .15 mg via INTRATHECAL

## 2011-11-26 MED ORDER — ZOLPIDEM TARTRATE 5 MG PO TABS: 5.0000 mg | ORAL_TABLET | Freq: Every evening | ORAL | Status: DC | PRN

## 2011-11-26 MED ORDER — TETANUS-DIPHTH-ACELL PERTUSSIS 5-2.5-18.5 LF-MCG/0.5 IM SUSP
0.5000 mL | Freq: Once | INTRAMUSCULAR | Status: AC
  Administered 2011-11-27: 0.5 mL via INTRAMUSCULAR
  Filled 2011-11-26: qty 0.5

## 2011-11-26 MED ORDER — METHYLERGONOVINE MALEATE 0.2 MG PO TABS: 0.2000 mg | ORAL_TABLET | ORAL | Status: DC | PRN

## 2011-11-26 MED ORDER — KETOROLAC TROMETHAMINE 30 MG/ML IJ SOLN: 30.0000 mg | Freq: Four times a day (QID) | INTRAMUSCULAR | Status: DC | PRN

## 2011-11-26 MED ORDER — OXYTOCIN 20 UNITS IN LACTATED RINGERS INFUSION - SIMPLE
125.0000 mL/h | INTRAVENOUS | Status: AC
  Filled 2011-11-26: qty 1000

## 2011-11-26 MED ORDER — ONDANSETRON HCL 4 MG/2ML IJ SOLN: 4.0000 mg | Freq: Three times a day (TID) | INTRAMUSCULAR | Status: DC | PRN

## 2011-11-26 MED ORDER — SIMETHICONE 80 MG PO CHEW: 80.0000 mg | CHEWABLE_TABLET | ORAL | Status: DC | PRN

## 2011-11-26 MED ORDER — DIPHENHYDRAMINE HCL 25 MG PO CAPS: 25.0000 mg | ORAL_CAPSULE | Freq: Four times a day (QID) | ORAL | Status: DC | PRN

## 2011-11-26 MED ORDER — DIPHENHYDRAMINE HCL 25 MG PO CAPS
25.0000 mg | ORAL_CAPSULE | ORAL | Status: DC | PRN
  Administered 2011-11-26: 25 mg via ORAL
  Filled 2011-11-26: qty 1

## 2011-11-26 MED ORDER — PHENYLEPHRINE HCL 10 MG/ML IJ SOLN
INTRAMUSCULAR | Status: DC | PRN
  Administered 2011-11-26: 80 ug via INTRAVENOUS

## 2011-11-26 MED ORDER — DIPHENHYDRAMINE HCL 50 MG/ML IJ SOLN: 12.5000 mg | INTRAMUSCULAR | Status: DC | PRN

## 2011-11-26 MED ORDER — PRENATAL MULTIVITAMIN CH
1.0000 | ORAL_TABLET | Freq: Every day | ORAL | Status: DC
  Administered 2011-11-27 – 2011-11-28 (×2): 1 via ORAL
  Filled 2011-11-26 (×2): qty 1

## 2011-11-26 MED ORDER — SCOPOLAMINE 1 MG/3DAYS TD PT72
MEDICATED_PATCH | TRANSDERMAL | Status: AC
  Administered 2011-11-26: 1.5 mg via TRANSDERMAL
  Filled 2011-11-26: qty 1

## 2011-11-26 MED ORDER — MORPHINE SULFATE 0.5 MG/ML IJ SOLN
INTRAMUSCULAR | Status: AC
  Filled 2011-11-26: qty 10

## 2011-11-26 MED ORDER — SODIUM CHLORIDE 0.9 % IJ SOLN: 3.0000 mL | Freq: Two times a day (BID) | INTRAMUSCULAR | Status: DC

## 2011-11-26 MED ORDER — KETOROLAC TROMETHAMINE 30 MG/ML IJ SOLN
INTRAMUSCULAR | Status: AC
  Filled 2011-11-26: qty 1

## 2011-11-26 MED ORDER — SODIUM CHLORIDE 0.9 % IV SOLN: 1.0000 ug/kg/h | INTRAVENOUS | Status: DC | PRN

## 2011-11-26 MED ORDER — OXYTOCIN 10 UNIT/ML IJ SOLN
INTRAMUSCULAR | Status: AC
  Filled 2011-11-26: qty 4

## 2011-11-26 MED ORDER — SODIUM CHLORIDE 0.9 % IV SOLN: 250.0000 mL | INTRAVENOUS | Status: DC

## 2011-11-26 MED ORDER — IBUPROFEN 600 MG PO TABS
600.0000 mg | ORAL_TABLET | Freq: Four times a day (QID) | ORAL | Status: DC
  Administered 2011-11-26 – 2011-11-27 (×2): 600 mg via ORAL
  Filled 2011-11-26 (×3): qty 1

## 2011-11-26 MED ORDER — BUPIVACAINE HCL (PF) 0.25 % IJ SOLN
INTRAMUSCULAR | Status: AC
  Filled 2011-11-26: qty 30

## 2011-11-26 MED ORDER — METHYLERGONOVINE MALEATE 0.2 MG/ML IJ SOLN: 0.2000 mg | INTRAMUSCULAR | Status: DC | PRN

## 2011-11-26 MED ORDER — GENTAMICIN SULFATE 40 MG/ML IJ SOLN
Freq: Three times a day (TID) | INTRAVENOUS | Status: AC
  Administered 2011-11-26 (×2): via INTRAVENOUS
  Filled 2011-11-26 (×2): qty 2.5

## 2011-11-26 MED ORDER — FENTANYL CITRATE 0.05 MG/ML IJ SOLN
INTRAMUSCULAR | Status: DC | PRN
  Administered 2011-11-26: 25 ug via INTRATHECAL

## 2011-11-26 MED ORDER — KETOROLAC TROMETHAMINE 30 MG/ML IJ SOLN
30.0000 mg | Freq: Four times a day (QID) | INTRAMUSCULAR | Status: DC | PRN
  Administered 2011-11-26: 30 mg via INTRAMUSCULAR

## 2011-11-26 MED ORDER — ONDANSETRON HCL 4 MG PO TABS: 4.0000 mg | ORAL_TABLET | ORAL | Status: DC | PRN

## 2011-11-26 MED ORDER — OXYCODONE-ACETAMINOPHEN 5-325 MG PO TABS: 1.0000 | ORAL_TABLET | ORAL | Status: DC | PRN

## 2011-11-26 MED ORDER — METOCLOPRAMIDE HCL 5 MG/ML IJ SOLN: 10.0000 mg | Freq: Three times a day (TID) | INTRAMUSCULAR | Status: DC | PRN

## 2011-11-26 MED ORDER — DIPHENHYDRAMINE HCL 50 MG/ML IJ SOLN: 25.0000 mg | INTRAMUSCULAR | Status: DC | PRN

## 2011-11-26 MED ORDER — GENTAMICIN SULFATE 40 MG/ML IJ SOLN
Freq: Once | INTRAMUSCULAR | Status: DC
  Filled 2011-11-26: qty 2.5

## 2011-11-26 MED ORDER — WITCH HAZEL-GLYCERIN EX PADS: 1.0000 "application " | MEDICATED_PAD | CUTANEOUS | Status: DC | PRN

## 2011-11-26 MED ORDER — 0.9 % SODIUM CHLORIDE (POUR BTL) OPTIME
TOPICAL | Status: DC | PRN
  Administered 2011-11-26: 1000 mL

## 2011-11-26 MED ORDER — IBUPROFEN 600 MG PO TABS: 600.0000 mg | ORAL_TABLET | Freq: Four times a day (QID) | ORAL | Status: DC | PRN

## 2011-11-26 MED ORDER — MENTHOL 3 MG MT LOZG: 1.0000 | LOZENGE | OROMUCOSAL | Status: DC | PRN

## 2011-11-26 MED ORDER — FENTANYL CITRATE 0.05 MG/ML IJ SOLN: 25.0000 ug | INTRAMUSCULAR | Status: DC | PRN

## 2011-11-26 MED ORDER — OXYTOCIN 20 UNITS IN LACTATED RINGERS INFUSION - SIMPLE
INTRAVENOUS | Status: DC | PRN
  Administered 2011-11-26 (×2): 20 [IU] via INTRAVENOUS

## 2011-11-26 MED ORDER — MUPIROCIN 2 % EX OINT
TOPICAL_OINTMENT | CUTANEOUS | Status: AC
  Administered 2011-11-26: 09:00:00
  Filled 2011-11-26: qty 22

## 2011-11-26 MED ORDER — FENTANYL CITRATE 0.05 MG/ML IJ SOLN
INTRAMUSCULAR | Status: AC
  Filled 2011-11-26: qty 2

## 2011-11-26 MED ORDER — EPHEDRINE 5 MG/ML INJ
INTRAVENOUS | Status: AC
  Filled 2011-11-26: qty 10

## 2011-11-26 MED ORDER — LANOLIN HYDROUS EX OINT: 1.0000 "application " | TOPICAL_OINTMENT | CUTANEOUS | Status: DC | PRN

## 2011-11-26 MED ORDER — ONDANSETRON HCL 4 MG/2ML IJ SOLN: 4.0000 mg | INTRAMUSCULAR | Status: DC | PRN

## 2011-11-26 MED ORDER — BISACODYL 10 MG RE SUPP: 10.0000 mg | Freq: Every day | RECTAL | Status: DC | PRN

## 2011-11-26 MED ORDER — SCOPOLAMINE 1 MG/3DAYS TD PT72
1.0000 | MEDICATED_PATCH | Freq: Once | TRANSDERMAL | Status: DC
  Administered 2011-11-26: 1.5 mg via TRANSDERMAL

## 2011-11-26 MED ORDER — ONDANSETRON HCL 4 MG/2ML IJ SOLN
INTRAMUSCULAR | Status: AC
  Filled 2011-11-26: qty 2

## 2011-11-26 SURGICAL SUPPLY — 42 items
BARRIER ADHS 3X4 INTERCEED (GAUZE/BANDAGES/DRESSINGS) ×2 IMPLANT
BENZOIN TINCTURE PRP APPL 2/3 (GAUZE/BANDAGES/DRESSINGS) ×2 IMPLANT
CHLORAPREP W/TINT 26ML (MISCELLANEOUS) ×2 IMPLANT
CLOTH BEACON ORANGE TIMEOUT ST (SAFETY) ×2 IMPLANT
CONTAINER PREFILL 10% NBF 15ML (MISCELLANEOUS) IMPLANT
DRESSING TELFA 8X3 (GAUZE/BANDAGES/DRESSINGS) ×2 IMPLANT
DRSG COVADERM 4X10 (GAUZE/BANDAGES/DRESSINGS) ×2 IMPLANT
ELECT REM PT RETURN 9FT ADLT (ELECTROSURGICAL) ×2
ELECTRODE REM PT RTRN 9FT ADLT (ELECTROSURGICAL) ×1 IMPLANT
EXTRACTOR VACUUM KIWI (MISCELLANEOUS) IMPLANT
EXTRACTOR VACUUM M CUP 4 TUBE (SUCTIONS) IMPLANT
GAUZE SPONGE 4X4 12PLY STRL LF (GAUZE/BANDAGES/DRESSINGS) ×2 IMPLANT
GLOVE BIO SURGEON STRL SZ 6.5 (GLOVE) ×2 IMPLANT
GLOVE BIOGEL PI IND STRL 7.0 (GLOVE) ×2 IMPLANT
GLOVE BIOGEL PI INDICATOR 7.0 (GLOVE) ×2
GOWN PREVENTION PLUS LG XLONG (DISPOSABLE) ×6 IMPLANT
KIT ABG SYR 3ML LUER SLIP (SYRINGE) IMPLANT
NEEDLE HYPO 25X1 1.5 SAFETY (NEEDLE) ×2 IMPLANT
NEEDLE HYPO 25X5/8 SAFETYGLIDE (NEEDLE) IMPLANT
NS IRRIG 1000ML POUR BTL (IV SOLUTION) ×2 IMPLANT
PACK C SECTION WH (CUSTOM PROCEDURE TRAY) ×2 IMPLANT
PAD ABD 7.5X8 STRL (GAUZE/BANDAGES/DRESSINGS) ×2 IMPLANT
RTRCTR C-SECT PINK 25CM LRG (MISCELLANEOUS) IMPLANT
SLEEVE SCD COMPRESS KNEE MED (MISCELLANEOUS) ×2 IMPLANT
STAPLER VISISTAT 35W (STAPLE) ×2 IMPLANT
STRIP CLOSURE SKIN 1/2X4 (GAUZE/BANDAGES/DRESSINGS) ×2 IMPLANT
SUT CHROMIC GUT AB #0 18 (SUTURE) IMPLANT
SUT MNCRL 0 VIOLET CTX 36 (SUTURE) ×3 IMPLANT
SUT MON AB 4-0 PS1 27 (SUTURE) IMPLANT
SUT MONOCRYL 0 CTX 36 (SUTURE) ×3
SUT PLAIN 2 0 (SUTURE)
SUT PLAIN 2 0 XLH (SUTURE) ×2 IMPLANT
SUT PLAIN ABS 2-0 CT1 27XMFL (SUTURE) IMPLANT
SUT VIC AB 0 CT1 27 (SUTURE) ×2
SUT VIC AB 0 CT1 27XBRD ANBCTR (SUTURE) ×2 IMPLANT
SUT VIC AB 2-0 CT1 27 (SUTURE) ×1
SUT VIC AB 2-0 CT1 TAPERPNT 27 (SUTURE) ×1 IMPLANT
SYR CONTROL 10ML LL (SYRINGE) ×2 IMPLANT
TAPE CLOTH SURG 4X10 WHT LF (GAUZE/BANDAGES/DRESSINGS) ×2 IMPLANT
TOWEL OR 17X24 6PK STRL BLUE (TOWEL DISPOSABLE) ×4 IMPLANT
TRAY FOLEY CATH 14FR (SET/KITS/TRAYS/PACK) ×2 IMPLANT
WATER STERILE IRR 1000ML POUR (IV SOLUTION) ×2 IMPLANT

## 2011-11-26 NOTE — Transfer of Care (Signed)
Immediate Anesthesia Transfer of Care Note  Patient: [name]  Procedure(s) Performed: Procedure(s) (LRB): CESAREAN SECTION (N/A)  Patient Location: PACU  Anesthesia Type: Spinal  Level of Consciousness: awake, alert  and oriented  Airway & Oxygen Therapy: Patient Spontanous Breathing  Post-op Assessment: Report given to PACU RN and Post -op Vital signs reviewed and stable  Post vital signs: Reviewed and stable  Complications: No apparent anesthesia complications

## 2011-11-26 NOTE — Op Note (Signed)
NAMELAURAANN, MISSEY              ACCOUNT NO.:  [id number]  MEDICAL RECORD NO.:  [id number]  LOCATION:  9104                          FACILITY:  [facility]  PHYSICIAN:  Maxie Better, M.D.DATE OF BIRTH:  1985/10/08  DATE OF PROCEDURE:  11/26/2011 DATE OF DISCHARGE:                              OPERATIVE REPORT   PREOPERATIVE DIAGNOSIS:  Previous cesarean section, term gestation.  POSTOPERATIVE DIAGNOSIS:  Previous cesarean section, term gestation.  PROCEDURES:  Repeat cesarean section, Kerr hysterotomy.  ANESTHESIA:  Spinal.  SURGEON:  Maxie Better, MD  ASSISTANT:  Marlinda Mike, CNM.  PROCEDURE:  Under adequate spinal anesthesia, the patient was placed in a supine position with a left lateral tilt.  She was prepped with ChloraPrep and Betadine.  Indwelling Foley catheter was sterilely placed.  She was sterilely draped.  A 0.25% Marcaine was injected along the previous Pfannenstiel skin incision.  Pfannenstiel skin incision was then made through the previous scar, carried down to the rectus fascia. The rectus fascia was opened transversely.  Rectus fascia was then bluntly and sharply dissected off the rectus muscle in a superior and inferior fashion.  The rectus muscle was split in the midline.  The parietal peritoneum was entered bluntly and extended.  There was some omental adhesion to the anterior abdominal wall which was lysed.  The bladder had some bladder adhesions, which was opened transversely.  The bladder was then bluntly dissected off the lower uterine segment and displaced inferiorly with a bladder retractor.  A curvilinear low transverse uterine incision was then made and extended with bandage scissors.  Incidental rupture of amniotic fluid was noted, clear fluid, copious amounts.  Subsequent delivery of a live female from the left occiput transverse position was accomplished.  The baby was bulb suctioned on the abdomen.  Cord was clamped, cut.  The baby  was transferred to the awaiting pediatrician who assigned Apgars of 9 and 9 at 1 and 5 minutes.  The placenta was spontaneous, intact, not sent to pathology.  Uterine cavity was cleaned of debris.  Uterine incision had no extension.  It was closed in 2 layers, the 1st layer with 0 Monocryl running locked stitch, 2nd layer was imbricated using 0 Monocryl suture. Normal tubes and ovaries were noted bilaterally.  The abdomen was copiously irrigated and suctioned of debris.  The paracolic gutters were cleaned of debris.  Interceed was placed in the lower uterine segment overlying the incision.  The parietal peritoneum was then closed with 2- 0 Vicryl.  The rectus fascia closed with 0 Vicryl x2.  The subcutaneous area was irrigated, small bleeders cauterized.  Interrupted 2-0 plain sutures placed and the skin approximated with Ethicon staples.  SPECIMENS:  Placenta not sent to pathology.  ESTIMATED BLOOD LOSS:  500 mL.  INTRAOPERATIVE FLUID:  2 L.  URINE OUTPUT:  75 mL.  COUNT:  Sponge and instrument counts x2 was correct.  COMPLICATIONS:  None.  Weight of the baby was 7 pounds.  The patient tolerated the procedure well, was transferred to recovery room in stable condition.     Maxie Better, M.D.     [place]/MEDQ  D:  11/26/2011  T:  11/26/2011  Job:  [id number]

## 2011-11-26 NOTE — Brief Op Note (Signed)
11/26/2011  10:06 AM  PATIENT:  [name]  26 y.o. female  PRE-OPERATIVE DIAGNOSIS:  Previous Cesarean Section Term gestation  POST-OPERATIVE DIAGNOSIS:  previous ceserean section, term gestation  PROCEDURE:  Procedure(s) (LRB):Repeat Low transverse CESAREAN SECTION (N/A)  SURGEON:  Surgeon(s) and Role:    * Deric Bocock Cathie Beams, MD - Primary  PHYSICIAN ASSISTANT:   ASSISTANTS: Marlinda Mike, CNM   ANESTHESIA:   spinal  EBL:  Total I/O In: 2000 [I.V.:2000] Out: 575 [Urine:75; Blood:500]  FINDINGS: LIVE FEMALE NL TUBES AND OVARIES, COPIOUS CLEAR amniotic fluid  BLOOD ADMINISTERED:none  DRAINS: none   LOCAL MEDICATIONS USED:  MARCAINE     SPECIMEN:  Source of Specimen:  placenta  DISPOSITION OF SPECIMEN:  N/A  COUNTS:  YES  TOURNIQUET:  * No tourniquets in log *  DICTATION: .Other Dictation: Dictation Number [id number]  PLAN OF CARE: Admit to inpatient   PATIENT DISPOSITION:  PACU - hemodynamically stable.   Delay start of Pharmacological VTE agent (>24hrs) due to surgical blood loss or risk of bleeding: no

## 2011-11-26 NOTE — Anesthesia Postprocedure Evaluation (Signed)
Anesthesia Post Note  Patient: [name]  Procedure(s) Performed: Procedure(s) (LRB): CESAREAN SECTION (N/A)  Anesthesia type: Spinal  Patient location: PACU  Post pain: Pain level controlled  Post assessment: Post-op Vital signs reviewed  Last Vitals:  Filed Vitals:   11/26/11 1115  BP: 112/80  Pulse: 54  Temp:   Resp: 19    Post vital signs: Reviewed  Level of consciousness: awake  Complications: No apparent anesthesia complications

## 2011-11-26 NOTE — Addendum Note (Signed)
Addendum  created 11/26/11 1636 by Kito Cuffe M Kyona Chauncey, RN   Modules edited:Notes Section    

## 2011-11-26 NOTE — Anesthesia Postprocedure Evaluation (Signed)
  Anesthesia Post-op Note  Patient: [name]  Procedure(s) Performed: Procedure(s) (LRB): CESAREAN SECTION (N/A)  Patient Location: PACU and Mother/Baby  Anesthesia Type: Spinal  Level of Consciousness: awake, alert  and oriented  Airway and Oxygen Therapy: Patient Spontanous Breathing  Post-op Pain: mild  Post-op Assessment: Post-op Vital signs reviewed  Post-op Vital Signs: Reviewed and stable  Complications: No apparent anesthesia complications

## 2011-11-26 NOTE — Anesthesia Preprocedure Evaluation (Signed)
Anesthesia Evaluation  Patient identified by MRN, date of birth, ID band Patient awake    Reviewed: Allergy & Precautions, H&P , NPO status , Patient's Chart, lab work & pertinent test results  Airway Mallampati: II TM Distance: >3 FB Neck ROM: Full    Dental No notable dental hx. (+) Teeth Intact   Pulmonary neg pulmonary ROS,  breath sounds clear to auscultation  Pulmonary exam normal       Cardiovascular Rhythm:Regular Rate:Normal  Hx/o Precclampsia with first pregnancy y    Neuro/Psych negative neurological ROS  negative psych ROS   GI/Hepatic negative GI ROS, Neg liver ROS,   Endo/Other  negative endocrine ROS  Renal/GU negative Renal ROS  negative genitourinary   Musculoskeletal negative musculoskeletal ROS (+)   Abdominal   Peds  Hematology negative hematology ROS (+)   Anesthesia Other Findings   Reproductive/Obstetrics (+) Pregnancy                           Anesthesia Physical Anesthesia Plan  ASA: II  Anesthesia Plan: Spinal   Post-op Pain Management:    Induction:   Airway Management Planned:   Additional Equipment:   Intra-op Plan:   Post-operative Plan:   Informed Consent: I have reviewed the patients History and Physical, chart, labs and discussed the procedure including the risks, benefits and alternatives for the proposed anesthesia with the patient or authorized representative who has indicated his/her understanding and acceptance.   Dental advisory given  Plan Discussed with: CRNA, Anesthesiologist and Surgeon  Anesthesia Plan Comments:         Anesthesia Quick Evaluation

## 2011-11-27 ENCOUNTER — Encounter (HOSPITAL_COMMUNITY): Payer: Self-pay | Admitting: *Deleted

## 2011-11-27 DIAGNOSIS — D696 Thrombocytopenia, unspecified: Secondary | ICD-10-CM

## 2011-11-27 DIAGNOSIS — O9902 Anemia complicating childbirth: Secondary | ICD-10-CM | POA: Diagnosis present

## 2011-11-27 HISTORY — DX: Anemia complicating childbirth: O99.02

## 2011-11-27 HISTORY — DX: Thrombocytopenia, unspecified: D69.6

## 2011-11-27 LAB — CBC
HCT: 28.6 % — ABNORMAL LOW (ref 36.0–46.0)
Hemoglobin: 9.2 g/dL — ABNORMAL LOW (ref 12.0–15.0)
MCHC: 32.2 g/dL (ref 30.0–36.0)
MCV: 81.7 fL (ref 78.0–100.0)
RDW: 13.9 % (ref 11.5–15.5)

## 2011-11-27 MED ORDER — POLYSACCHARIDE IRON COMPLEX 150 MG PO CAPS
150.0000 mg | ORAL_CAPSULE | Freq: Every day | ORAL | Status: DC
  Administered 2011-11-27 – 2011-11-28 (×2): 150 mg via ORAL
  Filled 2011-11-27 (×3): qty 1

## 2011-11-27 MED ORDER — HYDROCODONE-ACETAMINOPHEN 5-325 MG PO TABS
1.0000 | ORAL_TABLET | ORAL | Status: DC | PRN
  Administered 2011-11-27 – 2011-11-28 (×5): 1 via ORAL
  Filled 2011-11-27 (×5): qty 1

## 2011-11-27 MED ORDER — DOCUSATE SODIUM 100 MG PO CAPS
100.0000 mg | ORAL_CAPSULE | Freq: Every day | ORAL | Status: DC
  Administered 2011-11-27 – 2011-11-28 (×2): 100 mg via ORAL
  Filled 2011-11-27 (×2): qty 1

## 2011-11-27 MED ORDER — ACETAMINOPHEN 325 MG PO TABS: 650.0000 mg | ORAL_TABLET | ORAL | Status: DC | PRN

## 2011-11-27 NOTE — Progress Notes (Signed)
Subjective: POD# 1 Information for the patient's newborn:  Tela, Kotecki [[id number]]  female   / circ planned today  Reports feeling well, OOB in chair this AM. Feeding: breast (pumping and feeding) Patient reports tolerating PO.  Breast symptoms: none Pain controlled with Motrin and Percocet. Denies HA/SOB/C/P/N/V/dizziness. Flatus present. She reports vaginal bleeding as normal, without clots.  She is ambulating, urinating without difficult.     Objective:   VS:  Filed Vitals:   11/26/11 1817 11/26/11 2140 11/27/11 0145 11/27/11 0535  BP: 105/71 101/64 104/70 96/64  Pulse: 69 76 68 76  Temp:  97.6 F (36.4 C) 98.1 F (36.7 C) 98.4 F (36.9 C)  TempSrc:  Oral Oral Oral  Resp:  18 18 18   Height:      Weight:      SpO2:  98% 98% 98%      Intake/Output Summary (Last 24 hours) at 11/27/11 0830 Last data filed at 11/27/11 0816  Gross per 24 hour  Intake   4412 ml  Output   5725 ml  Net  -1313 ml         Basename 11/27/11 0515 11/26/11 0833  WBC 7.9 8.5  HGB 9.2* 10.9*  HCT 28.6* 34.6*  PLT 95* 129*     Blood type: O/Positive/-- (10/01 0000)  Rubella: Immune (10/01 0000)     Physical Exam:  General: alert, cooperative and no distress CV: Regular rate and rhythm Resp: clear Abdomen: soft, nontender, normal bowel sounds Incision: dry, intact and minimal dry serous drainage on dressing drainage present Uterine Fundus: firm, below umbilicus, nontender Lochia: minimal Ext: edema trace and Homans sign is negative, no sign of DVT      Assessment/Plan: 26 y.o.   POD# 1.  s/p Cesarean Delivery.  Indications: sceduled repeat                Principal Problem:  *PP care - s/p rC/S 5/13 Active Problems:  Thrombocytopenia  Maternal anemia, with delivery  Doing well, stable.     Start oral Fe and colace today Hold NSAID's for now and rpt platelets in AM          Advance diet as tolerated Ambulate, shower and DC dressing this am Routine post-op  care  Veneta Sliter 11/27/2011, 8:30 AM

## 2011-11-28 LAB — COMPREHENSIVE METABOLIC PANEL
ALT: 9 U/L (ref 0–35)
AST: 20 U/L (ref 0–37)
Albumin: 2.5 g/dL — ABNORMAL LOW (ref 3.5–5.2)
CO2: 26 mEq/L (ref 19–32)
Calcium: 9.2 mg/dL (ref 8.4–10.5)
Creatinine, Ser: 0.61 mg/dL (ref 0.50–1.10)
Sodium: 136 mEq/L (ref 135–145)

## 2011-11-28 LAB — CBC
MCH: 26.7 pg (ref 26.0–34.0)
MCV: 83.9 fL (ref 78.0–100.0)
Platelets: 111 10*3/uL — ABNORMAL LOW (ref 150–400)
RBC: 3.6 MIL/uL — ABNORMAL LOW (ref 3.87–5.11)
RDW: 14.1 % (ref 11.5–15.5)
WBC: 8.5 10*3/uL (ref 4.0–10.5)

## 2011-11-28 MED ORDER — HYDROCODONE-ACETAMINOPHEN 5-325 MG PO TABS: 1.0000 | ORAL_TABLET | ORAL | Status: AC | PRN

## 2011-11-28 MED ORDER — POLYSACCHARIDE IRON COMPLEX 150 MG PO CAPS: 150.0000 mg | ORAL_CAPSULE | Freq: Every day | ORAL | Status: DC

## 2011-11-28 NOTE — Discharge Summary (Signed)
POSTOPERATIVE DISCHARGE SUMMARY:  Patient ID: [name] MRN: [id number] DOB/AGE: 13-Sep-1985 26 y.o.  Admit date: 11/26/2011 Discharge date:  11/28/2011  Admission Diagnoses: 39 weeks / previous cesarean section for elective repeat     Discharge Diagnoses:   Term Pregnancy-delivered POD 2 /p cesarean section Gestational Thrombocytopenia - stable  Prenatal history: G2P0102   EDC : 11/25/2011 [facility] at [facility]  Primary provider : Carianne Taira Prenatal course complicated by  previous CS  Prenatal Labs: ABO, Rh: O (10/01 0000) positive Antibody: Negative (10/01 0000) Rubella: Immune (10/01 0000)  RPR: NON REACTIVE (05/13 0838)  HBsAg: Negative (10/01 0000)  HIV: Non-reactive (10/01 0000)  GBS: Positive (05/11 0000)  1 hr Glucola : NL   Medical / Surgical History :  Past medical history:  Past Medical History  Diagnosis Date  . Acute heart failure 2009    With pregnancy  . Pre-eclampsia 2009    first pregnancy  . PP care - s/p rC/S 5/13 11/26/2011  . Thrombocytopenia 11/27/2011  . Maternal anemia, with delivery 11/27/2011    Past surgical history:  Past Surgical History  Procedure Date  . Cesarean section   . Cesarean section 11/26/2011    Procedure: CESAREAN SECTION;  Surgeon: Serita Kyle, MD;  Location: [facility];  Service: Gynecology;  Laterality: N/A;  EDD: 11/25/11    Family History: History reviewed. No pertinent family history.  Social History:  reports that she has never smoked. She does not have any smokeless tobacco history on file. She reports that she drinks alcohol. She reports that she does not use illicit drugs.   Allergies: Penicillins    Current Medications at time of admission:  Prior to Admission medications   Medication Sig Start Date End Date Taking? Authorizing Provider  folic acid (FOLVITE) 1 MG tablet Take 1 mg by mouth 3 (three) times daily.    Yes Historical Provider, MD  Prenatal Vit-Fe Fumarate-FA  (PRENATAL MULTIVITAMIN) TABS Take 1 tablet by mouth daily.   Yes Historical Provider, MD  acetaminophen (TYLENOL) 325 MG tablet Take 650 mg by mouth daily as needed. headache    Historical Provider, MD                PYRIDOXINE HCL PO Take 1 tablet by mouth 2 (two) times daily.    Historical Provider, MD      Intrapartum Course: NONE    Procedures: Cesarean section delivery of female newborn by Dr Cherly Hensen  See operative report for further details  Postoperative / postpartum course: thrombocytopenia- ibuprofen held due to nadir of platelets below 100 / Repeat platelet on day of discharge 111. PIH labs nl  Physical Exam:   VSS: Blood pressure 109/72, pulse 75, temperature 97.2 F (36.2 C), temperature source Oral, resp. rate 18, height 5\' 3"  (1.6 m), weight 170 lb (77.111 kg), last menstrual period 02/25/2011, SpO2 98.00%, unknown if currently breastfeeding.  LABS: WBC/Hgb/Hct/Plts:  8.5/9.6/30.2/111 (05/15 0520)   General:NAD / Calm Heart:RR Lungs:Clear Abdomen: soft non-distended / active bowel sounds Extremities:No edema / no evidence of DVT  Incision:  approximated with staples and steristrips / no erythema / no ecchymosis / no drainage Staples: intact - remove at [facility] Friday  Discharge Instructions:  Discharged Condition: stable Activity: pelvic rest and postoperative restrictions x 2 weeks Diet: routine Medications:  Medication List  As of 11/28/2011 10:45 AM   TAKE these medications         acetaminophen 325 MG tablet   Commonly  known as: TYLENOL   Take 650 mg by mouth daily as needed. headache      folic acid 1 MG tablet   Commonly known as: FOLVITE   Take 1 mg by mouth 3 (three) times daily.      HYDROcodone-acetaminophen 5-325 MG per tablet   Commonly known as: NORCO   Take 1 tablet by mouth every 4 (four) hours as needed (q 4-6 hours as needed).      iron polysaccharides 150 MG capsule   Commonly known as: NIFEREX   Take 1 capsule (150 mg total) by mouth  daily.      prenatal multivitamin Tabs   Take 1 tablet by mouth daily.      PYRIDOXINE HCL PO   Take 1 tablet by mouth 2 (two) times daily.           Condition: stable Postpartum Instructions: refer to practice specific booklet Discharge to: home Disposition: 01-Home or Self Care Follow up :  Follow-up Information    Follow up with Tarun Patchell A, MD. Schedule an appointment as soon as possible for a visit in 6 weeks. (staple removal at [facility] with nurse-midwife Friday - call for apt time)    Contact information:   [address] [place] [address] [phone]           Signed: Marlinda Mike 11/28/2011, 10:45 AM

## 2011-11-28 NOTE — Progress Notes (Signed)
POSTOPERATIVE DAY # 2 S/P cesarean section   S:         Reports feeling well - ready to go home             Tolerating po intake / no nausea / no vomiting / + flatus / no BM             Bleeding is light             Pain controlled with motrin and percocet             Up ad lib / ambulatory  Newborn breast feeding  / Circumcision done   O:  A & O x 3 NAD             VS: Blood pressure 109/72, pulse 75, temperature 97.2 F (36.2 C), temperature source Oral, resp. rate 18, height 5\' 3"  (1.6 m), weight 170 lb (77.111 kg), last menstrual period 02/25/2011, SpO2 98.00%, unknown if currently breastfeeding.  LABS: WBC/Hgb/Hct/Plts:  8.5/9.6/30.2/111 (05/15 0520)   Lungs: Clear and unlabored  Heart: regular rate and rhythm / no mumurs  Abdomen: soft, non-tender, non-distended              Fundus: firm, non-tender, U-1             Dressing OFF              Incision:  approximated with staples / steri-strips / no erythema / no ecchymosis / no drainage  Perineum: no edema  Lochia: light  Extremities: no edema, no calf pain or tenderness, negative Homans  A:        POD # 2 S/P cesarean section            Stable status  P:        Routine postoperative care              Discharge today - will leave this pm when husband off work     Marlinda Mike 11/28/2011, 10:41 AM

## 2011-11-29 ENCOUNTER — Encounter (HOSPITAL_COMMUNITY): Payer: Self-pay | Admitting: *Deleted

## 2014-05-17 ENCOUNTER — Encounter (HOSPITAL_COMMUNITY): Payer: Self-pay | Admitting: *Deleted

## 2015-09-13 DIAGNOSIS — H5213 Myopia, bilateral: Secondary | ICD-10-CM | POA: Diagnosis not present

## 2016-04-12 DIAGNOSIS — Z01419 Encounter for gynecological examination (general) (routine) without abnormal findings: Secondary | ICD-10-CM | POA: Diagnosis not present

## 2016-04-12 DIAGNOSIS — Z1151 Encounter for screening for human papillomavirus (HPV): Secondary | ICD-10-CM | POA: Diagnosis not present

## 2016-04-12 DIAGNOSIS — Z6821 Body mass index (BMI) 21.0-21.9, adult: Secondary | ICD-10-CM | POA: Diagnosis not present

## 2016-04-19 ENCOUNTER — Encounter: Payer: Self-pay | Admitting: Primary Care

## 2016-04-19 ENCOUNTER — Ambulatory Visit (INDEPENDENT_AMBULATORY_CARE_PROVIDER_SITE_OTHER): Payer: 59 | Admitting: Primary Care

## 2016-04-19 VITALS — BP 108/72 | HR 61 | Temp 98.0°F | Ht 63.5 in | Wt 127.1 lb

## 2016-04-19 DIAGNOSIS — R1011 Right upper quadrant pain: Secondary | ICD-10-CM | POA: Diagnosis not present

## 2016-04-19 HISTORY — DX: Right upper quadrant pain: R10.11

## 2016-04-19 NOTE — Assessment & Plan Note (Signed)
Ongoing since pregnancy 8 years ago. Pain occurs several times weekly to monthly. Exam with mild tenderness to RUQ and epigastric region. No acute distress or alarm signs.  Abdominal US of RUQ ordered for re-evaluation of gall bladder.

## 2016-04-19 NOTE — Progress Notes (Signed)
Pre visit review using our clinic review tool, if applicable. No additional management support is needed unless otherwise documented below in the visit note. 

## 2016-04-19 NOTE — Patient Instructions (Signed)
Stop by the front desk and speak with either Rosaria Ferries or Ebony Hail regarding your Ultrasound. I will notify you of your results once received.   It was a pleasure to meet you today! Please don't hesitate to call me with any questions. Welcome to [facility]!

## 2016-04-19 NOTE — Progress Notes (Signed)
Subjective:    Patient ID: [name], female    DOB: 12/29/1985, 30 y.o.   MRN: [id number]  HPI  [name] is a 30 year old female who presents today to establish care and discuss the problems mentioned below. Will obtain old records.  1) Abdominal Pain: Located to right upper quadrant with radiation around to her back. History of this 8 years ago during her pregnancy with her daughter and has been intermittently present since. She completed an ultrasound in 2009 which found gall bladder sludge without wall thickening or inflammation. Her flares occur several times weekly to monthly with pressure, sharp, achy pain with nausea. Her flares will occur within 30 minutes after eating, but not all the time. Denies fevers, increased pain, vomiting.   Review of Systems  Constitutional: Negative for fatigue and fever.  Respiratory: Negative for shortness of breath.   Cardiovascular: Negative for chest pain.  Gastrointestinal: Positive for abdominal pain and nausea. Negative for constipation, diarrhea and vomiting.  Allergic/Immunologic: Negative for environmental allergies.       Past Medical History:  Diagnosis Date  . Acute heart failure ([place]) 2009   With pregnancy  . Chicken pox   . History of UTI   . Maternal anemia, with delivery 11/27/2011  . PP care - s/p rC/S 5/13 11/26/2011  . Pre-eclampsia 2009   first pregnancy  . Thrombocytopenia ([place]) 11/27/2011     Social History   Social History  . Marital status: Married    Spouse name: N/A  . Number of children: N/A  . Years of education: N/A   Occupational History  . Not on file.   Social History Main Topics  . Smoking status: Never Smoker  . Smokeless tobacco: Not on file  . Alcohol use Yes     Comment: socially  . Drug use: No  . Sexual activity: Yes   Other Topics Concern  . Not on file   Social History Narrative   Married.   2 children.   Works as a Marine scientist at [employer].    Enjoys spending time with her family,  decorate.    Past Surgical History:  Procedure Laterality Date  . CESAREAN SECTION    . CESAREAN SECTION  11/26/2011   Procedure: CESAREAN SECTION;  Surgeon: Marvene Staff, MD;  Location: [facility];  Service: Gynecology;  Laterality: N/A;  EDD: 11/25/11    Family History  Problem Relation Age of Onset  . Prostate cancer Father   . Hyperlipidemia Father   . Hypertension Father   . Breast cancer Paternal Grandmother   . Diabetes Paternal Grandmother   . Diabetes Paternal Grandfather     Allergies  Allergen Reactions  . Penicillins Hives    Childhood reaction    Current Outpatient Prescriptions on File Prior to Visit  Medication Sig Dispense Refill  . acetaminophen (TYLENOL) 325 MG tablet Take 650 mg by mouth daily as needed. headache     No current facility-administered medications on file prior to visit.     BP 108/72   Pulse 61   Temp 98 F (36.7 C) (Oral)   Ht 5' 3.5" (1.613 m)   Wt 127 lb 1.9 oz (57.7 kg)   LMP 03/28/2016   SpO2 99%   BMI 22.17 kg/m    Objective:   Physical Exam  Constitutional: She appears well-nourished.  Neck: Neck supple.  Cardiovascular: Normal rate and regular rhythm.   Pulmonary/Chest: Effort normal and breath sounds normal.  Abdominal:  Soft. Normal appearance and bowel sounds are normal. There is tenderness in the right upper quadrant and epigastric area.  Mild tenderness, no distress  Skin: Skin is warm and dry.          Assessment & Plan:

## 2016-04-27 ENCOUNTER — Ambulatory Visit
Admission: RE | Admit: 2016-04-27 | Discharge: 2016-04-27 | Disposition: A | Discharge status: Home / Self Care | Payer: 59 | Source: Ambulatory Visit | Attending: Primary Care | Admitting: Primary Care

## 2016-04-27 ENCOUNTER — Other Ambulatory Visit: Payer: Self-pay | Admitting: Primary Care

## 2016-04-27 DIAGNOSIS — R1011 Right upper quadrant pain: Secondary | ICD-10-CM | POA: Insufficient documentation

## 2016-04-30 ENCOUNTER — Other Ambulatory Visit: Payer: Self-pay | Admitting: Primary Care

## 2016-05-04 ENCOUNTER — Ambulatory Visit (HOSPITAL_COMMUNITY)
Admission: RE | Admit: 2016-05-04 | Discharge: 2016-05-04 | Disposition: A | Discharge status: Home / Self Care | Payer: 59 | Source: Ambulatory Visit | Attending: Primary Care | Admitting: Primary Care

## 2016-05-04 DIAGNOSIS — R1011 Right upper quadrant pain: Secondary | ICD-10-CM | POA: Diagnosis not present

## 2016-05-04 DIAGNOSIS — R110 Nausea: Secondary | ICD-10-CM | POA: Diagnosis not present

## 2016-05-04 MED ORDER — TECHNETIUM TC 99M MEBROFENIN IV KIT
5.2000 | PACK | Freq: Once | INTRAVENOUS | Status: AC | PRN
  Administered 2016-05-04: 5.2 via INTRAVENOUS

## 2016-05-07 ENCOUNTER — Ambulatory Visit (HOSPITAL_COMMUNITY): Payer: 59

## 2016-05-08 ENCOUNTER — Ambulatory Visit (HOSPITAL_COMMUNITY): Payer: 59

## 2016-05-11 ENCOUNTER — Other Ambulatory Visit: Payer: Self-pay | Admitting: Primary Care

## 2016-05-11 DIAGNOSIS — R1010 Upper abdominal pain, unspecified: Secondary | ICD-10-CM

## 2016-05-11 MED ORDER — PANTOPRAZOLE SODIUM 20 MG PO TBEC: 20.0000 mg | DELAYED_RELEASE_TABLET | Freq: Every day | ORAL | 1 refills | Status: DC

## 2016-05-28 ENCOUNTER — Telehealth: Payer: Self-pay | Admitting: Primary Care

## 2016-05-28 NOTE — Telephone Encounter (Signed)
-----   Message from Pleas Koch, NP sent at 05/11/2016  1:57 PM EDT ----- Regarding: Pantoprazole How's she doing on pantoprazole? Any improvement in pain?

## 2016-05-28 NOTE — Telephone Encounter (Signed)
Message left for patient to return my call.  

## 2016-05-31 NOTE — Telephone Encounter (Signed)
Left message on patient's voicemail to return call

## 2016-09-19 DIAGNOSIS — H5213 Myopia, bilateral: Secondary | ICD-10-CM | POA: Diagnosis not present

## 2016-09-19 DIAGNOSIS — H52223 Regular astigmatism, bilateral: Secondary | ICD-10-CM | POA: Diagnosis not present

## 2016-11-05 DIAGNOSIS — D485 Neoplasm of uncertain behavior of skin: Secondary | ICD-10-CM | POA: Diagnosis not present

## 2016-11-05 DIAGNOSIS — I781 Nevus, non-neoplastic: Secondary | ICD-10-CM | POA: Diagnosis not present

## 2016-11-05 DIAGNOSIS — Z1283 Encounter for screening for malignant neoplasm of skin: Secondary | ICD-10-CM | POA: Diagnosis not present

## 2016-11-05 DIAGNOSIS — D225 Melanocytic nevi of trunk: Secondary | ICD-10-CM | POA: Diagnosis not present

## 2016-12-07 DIAGNOSIS — Z30433 Encounter for removal and reinsertion of intrauterine contraceptive device: Secondary | ICD-10-CM | POA: Diagnosis not present

## 2016-12-07 DIAGNOSIS — T8332XA Displacement of intrauterine contraceptive device, initial encounter: Secondary | ICD-10-CM | POA: Diagnosis not present

## 2017-01-09 DIAGNOSIS — Z30431 Encounter for routine checking of intrauterine contraceptive device: Secondary | ICD-10-CM | POA: Diagnosis not present

## 2017-04-22 DIAGNOSIS — Z1151 Encounter for screening for human papillomavirus (HPV): Secondary | ICD-10-CM | POA: Diagnosis not present

## 2017-04-22 DIAGNOSIS — Z6822 Body mass index (BMI) 22.0-22.9, adult: Secondary | ICD-10-CM | POA: Diagnosis not present

## 2017-04-22 DIAGNOSIS — Z01419 Encounter for gynecological examination (general) (routine) without abnormal findings: Secondary | ICD-10-CM | POA: Diagnosis not present

## 2017-06-13 ENCOUNTER — Ambulatory Visit (INDEPENDENT_AMBULATORY_CARE_PROVIDER_SITE_OTHER): Payer: 59 | Admitting: Primary Care

## 2017-06-13 ENCOUNTER — Encounter (INDEPENDENT_AMBULATORY_CARE_PROVIDER_SITE_OTHER): Payer: Self-pay

## 2017-06-13 ENCOUNTER — Encounter: Payer: Self-pay | Admitting: Primary Care

## 2017-06-13 VITALS — BP 118/76 | HR 71 | Temp 98.2°F | Ht 64.0 in | Wt 135.0 lb

## 2017-06-13 DIAGNOSIS — R1011 Right upper quadrant pain: Secondary | ICD-10-CM | POA: Diagnosis not present

## 2017-06-13 DIAGNOSIS — Z0000 Encounter for general adult medical examination without abnormal findings: Secondary | ICD-10-CM | POA: Diagnosis not present

## 2017-06-13 LAB — CBC
HCT: 42.9 % (ref 36.0–46.0)
Hemoglobin: 14.2 g/dL (ref 12.0–15.0)
MCHC: 33.2 g/dL (ref 30.0–36.0)
MCV: 95.8 fl (ref 78.0–100.0)
PLATELETS: 166 10*3/uL (ref 150.0–400.0)
RBC: 4.47 Mil/uL (ref 3.87–5.11)
RDW: 12.2 % (ref 11.5–15.5)
WBC: 4.1 10*3/uL (ref 4.0–10.5)

## 2017-06-13 LAB — COMPREHENSIVE METABOLIC PANEL
ALT: 14 U/L (ref 0–35)
AST: 18 U/L (ref 0–37)
Albumin: 4.3 g/dL (ref 3.5–5.2)
Alkaline Phosphatase: 64 U/L (ref 39–117)
BILIRUBIN TOTAL: 0.9 mg/dL (ref 0.2–1.2)
BUN: 14 mg/dL (ref 6–23)
CHLORIDE: 104 meq/L (ref 96–112)
CO2: 29 meq/L (ref 19–32)
CREATININE: 0.64 mg/dL (ref 0.40–1.20)
Calcium: 9.3 mg/dL (ref 8.4–10.5)
GFR: 113.04 mL/min (ref >60.00)
GLUCOSE: 92 mg/dL (ref 70–99)
Potassium: 4.3 mEq/L (ref 3.5–5.1)
Sodium: 137 mEq/L (ref 135–145)
Total Protein: 7.1 g/dL (ref 6.0–8.3)

## 2017-06-13 LAB — LIPID PANEL
CHOL/HDL RATIO: 2
Cholesterol: 132 mg/dL (ref 0–200)
HDL: 62 mg/dL (ref >39.00)
LDL Cholesterol: 65 mg/dL (ref 0–99)
NONHDL: 70.3
Triglycerides: 29 mg/dL (ref 0.0–149.0)
VLDL: 5.8 mg/dL (ref 0.0–40.0)

## 2017-06-13 NOTE — Assessment & Plan Note (Signed)
Immunizations UTD. Pap UTD. Recommended regular exercise and continued healthy diet. Exam unremarkable. Labs pending. Follow up in 1 year.

## 2017-06-13 NOTE — Assessment & Plan Note (Addendum)
Continues to be intermittent, no worse. Abdominal ultrasound and HIDA scan in 2017 unremarkable. Offered GI referral for which she kindly declines.  She will keep an eye on symptoms and update if symptoms become worse. Check CMP today.

## 2017-06-13 NOTE — Progress Notes (Signed)
Subjective:    Patient ID: [name], female    DOB: [birth year], 31 y.o.   MRN: [id number]  HPI  [name] is a 31 year old female who presents today for complete physical.  Immunizations: -Tetanus: Completed in 2013 -Influenza: Completed in September 2018   Diet: She endorses a fair diet. Breakfast: Skips, yogurt Lunch: Sandwich, salad, soup Dinner: Meat, vegetable, starch Snacks: Cheese, nuts, grapes Desserts: None Beverages: Water, occasionally   Exercise: She exercise 2-3 times weekly. Eye exam: Completed in February 2018 Dental exam: No recent exam Pap Smear: Completed in October 2018   Review of Systems  Constitutional: Negative for unexpected weight change.  HENT: Negative for rhinorrhea.   Respiratory: Negative for cough and shortness of breath.   Cardiovascular: Negative for chest pain.  Gastrointestinal: Negative for constipation and diarrhea.       Intermittent RUQ abdominal pain  Genitourinary: Negative for difficulty urinating and menstrual problem.  Musculoskeletal: Negative for arthralgias and myalgias.  Skin: Negative for rash.  Allergic/Immunologic: Negative for environmental allergies.  Neurological: Negative for dizziness, numbness and headaches.  Psychiatric/Behavioral:       Denies concerns for anxiety and depression       Past Medical History:  Diagnosis Date  . Acute heart failure ([place]) 2009   With pregnancy  . Chicken pox   . History of UTI   . Maternal anemia, with delivery 11/27/2011  . PP care - s/p rC/S 5/13 11/26/2011  . Pre-eclampsia 2009   first pregnancy  . Thrombocytopenia ([place]) 11/27/2011     Social History   Socioeconomic History  . Marital status: Married    Spouse name: Not on file  . Number of children: Not on file  . Years of education: Not on file  . Highest education level: Not on file  Social Needs  . Financial resource strain: Not on file  . Food insecurity - worry: Not on file  . Food insecurity -  inability: Not on file  . Transportation needs - medical: Not on file  . Transportation needs - non-medical: Not on file  Occupational History  . Not on file  Tobacco Use  . Smoking status: Never Smoker  . Smokeless tobacco: Never Used  Substance and Sexual Activity  . Alcohol use: Yes    Comment: socially  . Drug use: No  . Sexual activity: Yes  Other Topics Concern  . Not on file  Social History Narrative   Married.   2 children.   Works as a Marine scientist at [employer].    Enjoys spending time with her family, decorate.    Past Surgical History:  Procedure Laterality Date  . CESAREAN SECTION    . CESAREAN SECTION  11/26/2011   Procedure: CESAREAN SECTION;  Surgeon: Marvene Staff, MD;  Location: [facility];  Service: Gynecology;  Laterality: N/A;  EDD: 11/25/11    Family History  Problem Relation Age of Onset  . Prostate cancer Father   . Hyperlipidemia Father   . Hypertension Father   . Breast cancer Paternal Grandmother   . Diabetes Paternal Grandmother   . Diabetes Paternal Grandfather     Allergies  Allergen Reactions  . Penicillins Hives    Childhood reaction    Current Outpatient Medications on File Prior to Visit  Medication Sig Dispense Refill  . acetaminophen (TYLENOL) 325 MG tablet Take 650 mg by mouth daily as needed. headache    . BIOTIN PO Take by mouth.    Marland Kitchen  cetirizine (ZYRTEC) 10 MG tablet Take 10 mg by mouth daily.    Marland Kitchen ibuprofen (ADVIL,MOTRIN) 200 MG tablet Take 200 mg by mouth every 6 (six) hours as needed.     No current facility-administered medications on file prior to visit.     BP 118/76   Pulse 71   Temp 98.2 F (36.8 C) (Oral)   Ht 5\' 4"  (1.626 m)   Wt 135 lb (61.2 kg)   LMP 05/29/2017   SpO2 98%   BMI 23.17 kg/m    Objective:   Physical Exam  Constitutional: She is oriented to person, place, and time. She appears well-nourished.  HENT:  Right Ear: Tympanic membrane and ear canal normal.  Left Ear: Tympanic membrane and ear  canal normal.  Nose: Nose normal.  Mouth/Throat: Oropharynx is clear and moist.  Eyes: Conjunctivae and EOM are normal. Pupils are equal, round, and reactive to light.  Neck: Neck supple. No thyromegaly present.  Cardiovascular: Normal rate and regular rhythm.  No murmur heard. Pulmonary/Chest: Effort normal and breath sounds normal. She has no rales.  Abdominal: Soft. Bowel sounds are normal. There is no tenderness.  Musculoskeletal: Normal range of motion.  Lymphadenopathy:    She has no cervical adenopathy.  Neurological: She is alert and oriented to person, place, and time. She has normal reflexes. No cranial nerve deficit.  Skin: Skin is warm and dry. No rash noted.  Psychiatric: She has a normal mood and affect.          Assessment & Plan:

## 2017-06-13 NOTE — Patient Instructions (Signed)
Complete lab work prior to leaving today. I will notify you of your results once received.   Start exercising. You should be getting 150 minutes of moderate intensity exercise weekly.  Continue to work on a healthy diet.   Follow up in 1 year for your annual exam or sooner if needed.  It was a pleasure to see you today!

## 2017-06-14 ENCOUNTER — Encounter: Payer: Self-pay | Admitting: *Deleted

## 2017-06-19 MED FILL — NITROFURANTOIN MONO-MCR 100: 100 | 5 days supply | Qty: 10 | Fill #0

## 2017-08-08 IMAGING — NM NM HEPATO W/GB/PHARM/[PERSON_NAME]
2 series · 12 of 12 positions shown · non-contrast
Comparison: None

CLINICAL DATA: Chronic RIGHT upper quadrant pain, nausea, bloating,
fatty food intolerance

EXAM:
NUCLEAR MEDICINE HEPATOBILIARY IMAGING WITH GALLBLADDER EF
TECHNIQUE: Sequential images of the abdomen were obtained [DATE] minutes
following intravenous administration of radiopharmaceutical. After
oral ingestion of Ensure, gallbladder ejection fraction was
determined. At 60 min, normal ejection fraction is greater than 33%.
RADIOPHARMACEUTICALS:  5.2 mCi Hc-VVm  Choletec IV

[he hepatobiliary · 3.43mm/px · 6 of 60 frames shown (1 of 2)]
[frame 6/60]
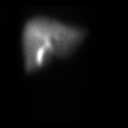
[frame 16/60]
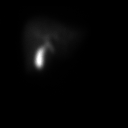
[frame 26/60]
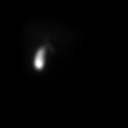
[frame 36/60]
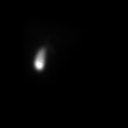
[frame 46/60]
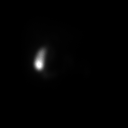
[frame 56/60]
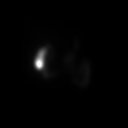

[he hepatobiliary · 3.43mm/px · 6 of 60 frames shown (2 of 2)]
[frame 6/60]
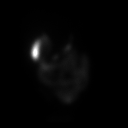
[frame 16/60]
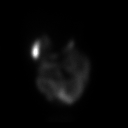
[frame 26/60]
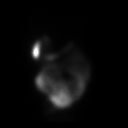
[frame 36/60]
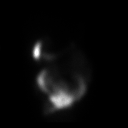
[frame 46/60]
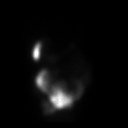
[frame 56/60]
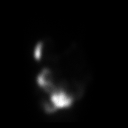

[12 of 12 positions shown; findings below may reference images not displayed]

FINDINGS: Normal tracer extraction from bloodstream indicating normal
hepatocellular function.

Normal excretion of tracer into biliary tree.

Gallbladder visualized at 4 min.

Small bowel visualized at 46 min.

No hepatic retention of tracer.

Subjectively normal emptying of tracer from gallbladder following
fatty meal stimulation.

Calculated gallbladder ejection fraction is 78%, normal.

Patient reported moderate achy pain following Ensure ingestion.

Normal gallbladder ejection fraction following Ensure ingestion is
greater than 33% at 1 hour.
IMPRESSION: Patent biliary tree.

Normal gallbladder response to fatty meal stimulation with a normal
gallbladder ejection fraction of 78%.

Moderate achy pain following Ensure ingestion.

## 2017-09-18 DIAGNOSIS — H5213 Myopia, bilateral: Secondary | ICD-10-CM | POA: Diagnosis not present

## 2017-09-18 DIAGNOSIS — H52223 Regular astigmatism, bilateral: Secondary | ICD-10-CM | POA: Diagnosis not present

## 2017-12-30 MED FILL — NITROFURANTOIN MONO-MCR 100: 100 | 5 days supply | Qty: 10 | Fill #0

## 2018-02-11 ENCOUNTER — Encounter: Payer: Self-pay | Admitting: Primary Care

## 2018-02-13 ENCOUNTER — Encounter: Payer: Self-pay | Admitting: Internal Medicine

## 2018-02-13 ENCOUNTER — Ambulatory Visit: Payer: 59 | Admitting: Internal Medicine

## 2018-02-13 VITALS — BP 106/70 | HR 91 | Temp 98.3°F | Wt 133.0 lb

## 2018-02-13 DIAGNOSIS — S80862A Insect bite (nonvenomous), left lower leg, initial encounter: Secondary | ICD-10-CM

## 2018-02-13 DIAGNOSIS — W57XXXA Bitten or stung by nonvenomous insect and other nonvenomous arthropods, initial encounter: Secondary | ICD-10-CM

## 2018-02-13 DIAGNOSIS — S80861A Insect bite (nonvenomous), right lower leg, initial encounter: Secondary | ICD-10-CM

## 2018-02-13 DIAGNOSIS — S70361A Insect bite (nonvenomous), right thigh, initial encounter: Secondary | ICD-10-CM

## 2018-02-13 DIAGNOSIS — S40861A Insect bite (nonvenomous) of right upper arm, initial encounter: Secondary | ICD-10-CM

## 2018-02-13 DIAGNOSIS — S50861A Insect bite (nonvenomous) of right forearm, initial encounter: Secondary | ICD-10-CM

## 2018-02-13 DIAGNOSIS — S50862A Insect bite (nonvenomous) of left forearm, initial encounter: Secondary | ICD-10-CM | POA: Diagnosis not present

## 2018-02-13 DIAGNOSIS — S40862A Insect bite (nonvenomous) of left upper arm, initial encounter: Secondary | ICD-10-CM

## 2018-02-13 DIAGNOSIS — M2550 Pain in unspecified joint: Secondary | ICD-10-CM | POA: Diagnosis not present

## 2018-02-13 DIAGNOSIS — S70362A Insect bite (nonvenomous), left thigh, initial encounter: Secondary | ICD-10-CM

## 2018-02-13 DIAGNOSIS — S30861A Insect bite (nonvenomous) of abdominal wall, initial encounter: Secondary | ICD-10-CM

## 2018-02-13 LAB — SEDIMENTATION RATE: SED RATE: 12 mm/h (ref 0–20)

## 2018-02-13 LAB — CBC
HCT: 37.8 % (ref 36.0–46.0)
Hemoglobin: 13.1 g/dL (ref 12.0–15.0)
MCHC: 34.7 g/dL (ref 30.0–36.0)
MCV: 91.3 fl (ref 78.0–100.0)
Platelets: 178 10*3/uL (ref 150.0–400.0)
RBC: 4.14 Mil/uL (ref 3.87–5.11)
RDW: 13.1 % (ref 11.5–15.5)
WBC: 4 10*3/uL (ref 4.0–10.5)

## 2018-02-13 LAB — HIGH SENSITIVITY CRP: CRP HIGH SENSITIVITY: 6.95 mg/L — AB (ref 0.000–5.000)

## 2018-02-13 LAB — VITAMIN D 25 HYDROXY (VIT D DEFICIENCY, FRACTURES): VITD: 40.42 ng/mL (ref 30.00–100.00)

## 2018-02-13 NOTE — Patient Instructions (Signed)
Joint Pain Joint pain can be caused by many things. The joint can be bruised, infected, weak from aging, or sore from exercise. The pain will probably go away if you follow your doctor's instructions for home care. If your joint pain continues, more tests may be needed to help find the cause of your condition. Follow these instructions at home: Watch your condition for any changes. Follow these instructions as told to lessen the pain that you are feeling:  Take medicines only as told by your doctor.  Rest the sore joint for as long as told by your doctor. If your doctor tells you to, raise (elevate) the painful joint above the level of your heart while you are sitting or lying down.  Do not do things that cause pain or make the pain worse.  If told, put ice on the painful area: ? Put ice in a plastic bag. ? Place a towel between your skin and the bag. ? Leave the ice on for 20 minutes, 2-3 times per day.  Wear an elastic bandage, splint, or sling as told by your doctor. Loosen the bandage or splint if your fingers or toes lose feeling (become numb) and tingle, or if they turn cold and blue.  Begin exercising or stretching the joint as told by your doctor. Ask your doctor what types of exercise are safe for you.  Keep all follow-up visits as told by your doctor. This is important.  Contact a doctor if:  Your pain gets worse and medicine does not help it.  Your joint pain does not get better in 3 days.  You have more bruising or swelling.  You have a fever.  You lose 10 pounds (4.5 kg) or more without trying. Get help right away if:  You are not able to move the joint.  Your fingers or toes become numb or they turn cold and blue. This information is not intended to replace advice given to you by your health care provider. Make sure you discuss any questions you have with your health care provider. Document Released: 06/20/2009 Document Revised: 12/08/2015 Document Reviewed:  04/13/2014 Elsevier Interactive Patient Education  2018 Elsevier Inc.  

## 2018-02-13 NOTE — Progress Notes (Signed)
Subjective:    Patient ID: [name], female    DOB: 11/25/1985, 32 y.o.   MRN: [id number]  HPI  Pt presents to the clinic today with c/o insect bites. She reports this started about 2 weeks ago. She has insect bites scattered over her arms, legs and abdomen. The bites do not itch or burn but are a little tender to touch. She does not recall getting bit by anything. She recently travelled to the beach. She denies changes in soaps, lotions or detergents and doesn't think it is anything allergy related. No one in her home has a similar rash. She has a new cat, but just took it to the vet and it has no fleas or diseases. She has tried Zyrtec without any improvement.  She also c/o multiple joint pain. She noticed this a few weeks ago as well. The joints affected are the bottoms of her feet, left knee and bilateral shoulders. She describes the pain as achy, but can be worse with ambulation or movement. She has only seen swelling of her left knee but reports prior sports injury to this knee. She denies any redness or rash over the joints. She denies recent increase in activity or trauma.She has no family history of autoimmune disorders. Given the tick bites and joint pain, she is concerned about tick born illness, but she denies pulling any ticks off of her. She denies confusion, fever, nausea, or diarrhea. She has taken Ibuprofen with minimal relief.    Review of Systems  Past Medical History:  Diagnosis Date  . Acute heart failure ([place]) 2009   With pregnancy  . Chicken pox   . History of UTI   . Maternal anemia, with delivery 11/27/2011  . PP care - s/p rC/S 5/13 11/26/2011  . Pre-eclampsia 2009   first pregnancy  . Thrombocytopenia ([place]) 11/27/2011    Current Outpatient Medications  Medication Sig Dispense Refill  . acetaminophen (TYLENOL) 325 MG tablet Take 650 mg by mouth daily as needed. headache    . cetirizine (ZYRTEC) 10 MG tablet Take 10 mg by mouth daily.    Marland Kitchen ibuprofen  (ADVIL,MOTRIN) 200 MG tablet Take 200 mg by mouth every 6 (six) hours as needed.    Marland Kitchen levonorgestrel (MIRENA) 20 MCG/24HR IUD 1 each by Intrauterine route once.     No current facility-administered medications for this visit.     Allergies  Allergen Reactions  . Penicillins Hives    Childhood reaction    Family History  Problem Relation Age of Onset  . Prostate cancer Father   . Hyperlipidemia Father   . Hypertension Father   . Breast cancer Paternal Grandmother   . Diabetes Paternal Grandmother   . Diabetes Paternal Grandfather     Social History   Socioeconomic History  . Marital status: Married    Spouse name: Not on file  . Number of children: Not on file  . Years of education: Not on file  . Highest education level: Not on file  Occupational History  . Not on file  Social Needs  . Financial resource strain: Not on file  . Food insecurity:    Worry: Not on file    Inability: Not on file  . Transportation needs:    Medical: Not on file    Non-medical: Not on file  Tobacco Use  . Smoking status: Never Smoker  . Smokeless tobacco: Never Used  Substance and Sexual Activity  . Alcohol use: Yes  Comment: socially  . Drug use: No  . Sexual activity: Yes  Lifestyle  . Physical activity:    Days per week: Not on file    Minutes per session: Not on file  . Stress: Not on file  Relationships  . Social connections:    Talks on phone: Not on file    Gets together: Not on file    Attends religious service: Not on file    Active member of club or organization: Not on file    Attends meetings of clubs or organizations: Not on file    Relationship status: Not on file  . Intimate partner violence:    Fear of current or ex partner: Not on file    Emotionally abused: Not on file    Physically abused: Not on file    Forced sexual activity: Not on file  Other Topics Concern  . Not on file  Social History Narrative   Married.   2 children.   Works as a Marine scientist at  [employer].    Enjoys spending time with her family, decorate.     Constitutional: Denies fever, malaise, fatigue, headache or abrupt weight changes.  Gastrointestinal: Denies abdominal pain, bloating, constipation, diarrhea or blood in the stool.  Musculoskeletal: Pt reports multiple joint pain. Denies decrease in range of motion, difficulty with gait, muscle pain.  Skin: Pt reports insect bites .Denies ulcercations.    No other specific complaints in a complete review of systems (except as listed in HPI above).     Objective:   Physical Exam  BP 106/70   Pulse 91   Temp 98.3 F (36.8 C) (Oral)   Wt 133 lb (60.3 kg)   SpO2 99%   BMI 22.83 kg/m  Wt Readings from Last 3 Encounters:  02/13/18 133 lb (60.3 kg)  06/13/17 135 lb (61.2 kg)  04/19/16 127 lb 1.9 oz (57.7 kg)    General: Appears her stated age, well developed, well nourished in NAD. Skin: Warm, dry and intact. Scattered papular lesions, < 1 cm noted on bilateral arms, abdomen and legs. Musculoskeletal: Pain with external rotation of bilateral shoulders. Normal internal rotation. Pain with palpation over the right AC joint. Strength 5/5 BUE. Normal flexion and extension of the left knee. Mild swelling below the patella of the left knee. Pain with palpation of the lateral joint line. Normal flexion, extension and rotation of the ankles. Strength 5/5 BLE. No pain with palpation of the heel or the arches. No difficulty with gait. Neurological: Alert and oriented. Sensation intact to BUE/BLECoordination normal.    BMET    Component Value Date/Time   NA 137 06/13/2017 1046   K 4.3 06/13/2017 1046   CL 104 06/13/2017 1046   CO2 29 06/13/2017 1046   GLUCOSE 92 06/13/2017 1046   BUN 14 06/13/2017 1046   CREATININE 0.64 06/13/2017 1046   CALCIUM 9.3 06/13/2017 1046   GFRNONAA >90 11/28/2011 0520   GFRAA >90 11/28/2011 0520    Lipid Panel     Component Value Date/Time   CHOL 132 06/13/2017 1046   TRIG 29.0  06/13/2017 1046   HDL 62.00 06/13/2017 1046   CHOLHDL 2 06/13/2017 1046   VLDL 5.8 06/13/2017 1046   LDLCALC 65 06/13/2017 1046    CBC    Component Value Date/Time   WBC 4.1 06/13/2017 1046   RBC 4.47 06/13/2017 1046   HGB 14.2 06/13/2017 1046   HCT 42.9 06/13/2017 1046   PLT 166.0 06/13/2017 1046  MCV 95.8 06/13/2017 1046   MCH 26.7 11/28/2011 0520   MCHC 33.2 06/13/2017 1046   RDW 12.2 06/13/2017 1046   LYMPHSABS 2.4 05/30/2008 0545   MONOABS 0.5 05/30/2008 0545   EOSABS 0.1 05/30/2008 0545   BASOSABS 0.1 05/30/2008 0545    Hgb A1C No results found for: HGBA1C          Assessment & Plan:   Insect Bites:  ? Sand fleas from beach Not bothersome so no need for topical steroids Continue Zyrtec prn  Multiple Joint Pain:  Will check Vit D, ESR, CRP, RF and ANA Will check B Burgdorferi, RMSF titer Continue Ibuprofen for now  Will follow up after labs are back, return precautions discussed Webb Silversmith, NP

## 2018-02-13 NOTE — Telephone Encounter (Signed)
[name], can we get her in with anyone today? I'm full. Thanks.

## 2018-02-17 LAB — ANTI-NUCLEAR AB-TITER (ANA TITER)

## 2018-02-17 LAB — ANA: Anti Nuclear Antibody(ANA): POSITIVE — AB

## 2018-02-17 LAB — ROCKY MTN SPOTTED FVR ABS PNL(IGG+IGM)
RMSF IGG: DETECTED — AB
RMSF IGM: NOT DETECTED

## 2018-02-17 LAB — B. BURGDORFI ANTIBODIES BY WB
B BURGDORFERI IGG ABS (IB): NEGATIVE
B BURGDORFERI IGM ABS (IB): NEGATIVE
LYME DISEASE 23 KD IGM: NONREACTIVE
LYME DISEASE 39 KD IGG: NONREACTIVE
LYME DISEASE 39 KD IGM: NONREACTIVE
LYME DISEASE 41 KD IGG: REACTIVE — AB
LYME DISEASE 45 KD IGG: REACTIVE — AB
LYME DISEASE 58 KD IGG: NONREACTIVE
LYME DISEASE 66 KD IGG: REACTIVE — AB
LYME DISEASE 93 KD IGG: NONREACTIVE
Lyme Disease 18 kD IgG: NONREACTIVE
Lyme Disease 23 kD IgG: NONREACTIVE
Lyme Disease 28 kD IgG: NONREACTIVE
Lyme Disease 30 kD IgG: NONREACTIVE
Lyme Disease 41 kD IgM: NONREACTIVE

## 2018-02-17 LAB — RHEUMATOID FACTOR: Rhuematoid fact SerPl-aCnc: <14 IU/mL (ref <14)

## 2018-02-17 LAB — REFLEX RMSF IGG TITER: RMSF IgG Titer: 1:1024 {titer} — ABNORMAL HIGH

## 2018-02-18 NOTE — Telephone Encounter (Signed)
Sending to treating provider.  

## 2018-02-19 ENCOUNTER — Other Ambulatory Visit: Payer: Self-pay | Admitting: Internal Medicine

## 2018-02-19 DIAGNOSIS — R768 Other specified abnormal immunological findings in serum: Secondary | ICD-10-CM

## 2018-02-19 DIAGNOSIS — M2550 Pain in unspecified joint: Secondary | ICD-10-CM

## 2018-02-19 DIAGNOSIS — R7982 Elevated C-reactive protein (CRP): Secondary | ICD-10-CM

## 2018-03-05 DIAGNOSIS — M359 Systemic involvement of connective tissue, unspecified: Secondary | ICD-10-CM | POA: Diagnosis not present

## 2018-03-05 DIAGNOSIS — M2550 Pain in unspecified joint: Secondary | ICD-10-CM | POA: Diagnosis not present

## 2018-03-05 DIAGNOSIS — R768 Other specified abnormal immunological findings in serum: Secondary | ICD-10-CM | POA: Diagnosis not present

## 2018-03-05 DIAGNOSIS — R21 Rash and other nonspecific skin eruption: Secondary | ICD-10-CM | POA: Diagnosis not present

## 2018-03-10 DIAGNOSIS — R5383 Other fatigue: Secondary | ICD-10-CM | POA: Diagnosis not present

## 2018-03-10 DIAGNOSIS — D8989 Other specified disorders involving the immune mechanism, not elsewhere classified: Secondary | ICD-10-CM | POA: Diagnosis not present

## 2018-03-10 DIAGNOSIS — M359 Systemic involvement of connective tissue, unspecified: Secondary | ICD-10-CM | POA: Diagnosis not present

## 2018-03-28 DIAGNOSIS — M2550 Pain in unspecified joint: Secondary | ICD-10-CM | POA: Diagnosis not present

## 2018-03-28 DIAGNOSIS — R21 Rash and other nonspecific skin eruption: Secondary | ICD-10-CM | POA: Diagnosis not present

## 2018-03-28 DIAGNOSIS — M359 Systemic involvement of connective tissue, unspecified: Secondary | ICD-10-CM | POA: Diagnosis not present

## 2018-03-28 DIAGNOSIS — R768 Other specified abnormal immunological findings in serum: Secondary | ICD-10-CM | POA: Diagnosis not present

## 2018-06-10 ENCOUNTER — Other Ambulatory Visit: Payer: Self-pay | Admitting: Primary Care

## 2018-06-10 DIAGNOSIS — Z0000 Encounter for general adult medical examination without abnormal findings: Secondary | ICD-10-CM

## 2018-06-10 DIAGNOSIS — D696 Thrombocytopenia, unspecified: Secondary | ICD-10-CM

## 2018-06-17 ENCOUNTER — Other Ambulatory Visit (INDEPENDENT_AMBULATORY_CARE_PROVIDER_SITE_OTHER): Payer: 59

## 2018-06-17 DIAGNOSIS — Z0000 Encounter for general adult medical examination without abnormal findings: Secondary | ICD-10-CM | POA: Diagnosis not present

## 2018-06-17 DIAGNOSIS — D696 Thrombocytopenia, unspecified: Secondary | ICD-10-CM

## 2018-06-17 LAB — LIPID PANEL
Cholesterol: 127 mg/dL (ref 0–200)
HDL: 59.5 mg/dL (ref >39.00)
LDL CALC: 59 mg/dL (ref 0–99)
NonHDL: 67.78
Total CHOL/HDL Ratio: 2
Triglycerides: 44 mg/dL (ref 0.0–149.0)
VLDL: 8.8 mg/dL (ref 0.0–40.0)

## 2018-06-17 LAB — COMPREHENSIVE METABOLIC PANEL
ALT: 11 U/L (ref 0–35)
AST: 13 U/L (ref 0–37)
Albumin: 4.3 g/dL (ref 3.5–5.2)
Alkaline Phosphatase: 60 U/L (ref 39–117)
BILIRUBIN TOTAL: 0.6 mg/dL (ref 0.2–1.2)
BUN: 12 mg/dL (ref 6–23)
CALCIUM: 9 mg/dL (ref 8.4–10.5)
CO2: 27 mEq/L (ref 19–32)
CREATININE: 0.56 mg/dL (ref 0.40–1.20)
Chloride: 104 mEq/L (ref 96–112)
GFR: 131.09 mL/min (ref >60.00)
Glucose, Bld: 91 mg/dL (ref 70–99)
Potassium: 4 mEq/L (ref 3.5–5.1)
SODIUM: 137 meq/L (ref 135–145)
TOTAL PROTEIN: 6.8 g/dL (ref 6.0–8.3)

## 2018-06-17 LAB — CBC
HCT: 40.7 % (ref 36.0–46.0)
Hemoglobin: 14 g/dL (ref 12.0–15.0)
MCHC: 34.3 g/dL (ref 30.0–36.0)
MCV: 93.2 fl (ref 78.0–100.0)
Platelets: 150 10*3/uL (ref 150.0–400.0)
RBC: 4.36 Mil/uL (ref 3.87–5.11)
RDW: 12.6 % (ref 11.5–15.5)
WBC: 4.6 10*3/uL (ref 4.0–10.5)

## 2018-06-24 ENCOUNTER — Encounter: Payer: 59 | Admitting: Primary Care

## 2018-06-25 ENCOUNTER — Encounter: Payer: Self-pay | Admitting: Primary Care

## 2018-06-25 ENCOUNTER — Ambulatory Visit (INDEPENDENT_AMBULATORY_CARE_PROVIDER_SITE_OTHER): Payer: 59 | Admitting: Primary Care

## 2018-06-25 VITALS — BP 98/68 | HR 51 | Temp 98.0°F | Ht 64.0 in | Wt 138.5 lb

## 2018-06-25 DIAGNOSIS — R1011 Right upper quadrant pain: Secondary | ICD-10-CM

## 2018-06-25 DIAGNOSIS — D696 Thrombocytopenia, unspecified: Secondary | ICD-10-CM | POA: Diagnosis not present

## 2018-06-25 DIAGNOSIS — Z0000 Encounter for general adult medical examination without abnormal findings: Secondary | ICD-10-CM

## 2018-06-25 NOTE — Assessment & Plan Note (Signed)
Recent CBC unremarkable. Continue to monitor.  

## 2018-06-25 NOTE — Assessment & Plan Note (Addendum)
Immunizations UTD. Pap smear UTD, follows with GYN. Recommended regular exercise, continue to work on diet. Exam unremarkable. Labs reviewed. Follow up in 1 year for CPE.

## 2018-06-25 NOTE — Progress Notes (Signed)
Subjective:    Patient ID: [name], female    DOB: 1986/04/11, 32 y.o.   MRN: [id number]  HPI  [name] is a 32 year old female who presents today for complete physical. [name] has no complaints today.  Immunizations: -Tetanus: Completed in 2013 -Influenza: Completed this season    Diet: [name] endorses a poor diet  Breakfast: Skips  Lunch: Left overs Dinner: Meat, vegetables, starch, tacos Snacks: None Desserts: None Beverages: Water, diet soda, coffee  Exercise: [name] is not exercising  Eye exam: Completed in 2019 Dental exam: Completes semi-annually  Pap Smear: Completed in 2017, follows with GYN.   Review of Systems  Constitutional: Negative for unexpected weight change.  HENT: Negative for rhinorrhea.   Respiratory: Negative for cough and shortness of breath.   Cardiovascular: Negative for chest pain.  Gastrointestinal: Negative for constipation and diarrhea.  Genitourinary: Negative for difficulty urinating and menstrual problem.  Musculoskeletal: Negative for arthralgias and myalgias.  Skin: Negative for rash.  Allergic/Immunologic: Negative for environmental allergies.  Neurological: Negative for dizziness, numbness and headaches.  Psychiatric/Behavioral: The patient is not nervous/anxious.        Past Medical History:  Diagnosis Date  . Acute heart failure ([place]) 2009   With pregnancy  . Chicken pox   . History of UTI   . Maternal anemia, with delivery 11/27/2011  . PP care - s/p rC/S 5/13 11/26/2011  . Pre-eclampsia 2009   first pregnancy  . Thrombocytopenia ([place]) 11/27/2011     Social History   Socioeconomic History  . Marital status: Married    Spouse name: Not on file  . Number of children: Not on file  . Years of education: Not on file  . Highest education level: Not on file  Occupational History  . Not on file  Social Needs  . Financial resource strain: Not on file  . Food insecurity:    Worry: Not on file    Inability: Not on file  .  Transportation needs:    Medical: Not on file    Non-medical: Not on file  Tobacco Use  . Smoking status: Never Smoker  . Smokeless tobacco: Never Used  Substance and Sexual Activity  . Alcohol use: Yes    Comment: socially  . Drug use: No  . Sexual activity: Yes  Lifestyle  . Physical activity:    Days per week: Not on file    Minutes per session: Not on file  . Stress: Not on file  Relationships  . Social connections:    Talks on phone: Not on file    Gets together: Not on file    Attends religious service: Not on file    Active member of club or organization: Not on file    Attends meetings of clubs or organizations: Not on file    Relationship status: Not on file  . Intimate partner violence:    Fear of current or ex partner: Not on file    Emotionally abused: Not on file    Physically abused: Not on file    Forced sexual activity: Not on file  Other Topics Concern  . Not on file  Social History Narrative   Married.   2 children.   Works as a Marine scientist at [employer].    Enjoys spending time with her family, decorate.    Past Surgical History:  Procedure Laterality Date  . CESAREAN SECTION    . CESAREAN SECTION  11/26/2011   Procedure: CESAREAN  SECTION;  Surgeon: Marvene Staff, MD;  Location: [facility];  Service: Gynecology;  Laterality: N/A;  EDD: 11/25/11    Family History  Problem Relation Age of Onset  . Prostate cancer Father   . Hyperlipidemia Father   . Hypertension Father   . Breast cancer Paternal Grandmother   . Diabetes Paternal Grandmother   . Diabetes Paternal Grandfather     Allergies  Allergen Reactions  . Penicillins Hives    Childhood reaction    Current Outpatient Medications on File Prior to Visit  Medication Sig Dispense Refill  . acetaminophen (TYLENOL) 325 MG tablet Take 650 mg by mouth daily as needed. headache    . cetirizine (ZYRTEC) 10 MG tablet Take 10 mg by mouth daily.    Marland Kitchen ibuprofen (ADVIL,MOTRIN) 200 MG tablet Take 200  mg by mouth every 6 (six) hours as needed.    Marland Kitchen levonorgestrel (MIRENA) 20 MCG/24HR IUD 1 each by Intrauterine route once.     No current facility-administered medications on file prior to visit.     BP 98/68   Pulse (!) 51   Temp 98 F (36.7 C) (Oral)   Ht 5\' 4"  (1.626 m)   Wt 138 lb 8 oz (62.8 kg)   SpO2 98%   BMI 23.77 kg/m    Objective:   Physical Exam  Constitutional: [name] is oriented to person, place, and time. [name] appears well-nourished.  HENT:  Mouth/Throat: No oropharyngeal exudate.  Eyes: Pupils are equal, round, and reactive to light. EOM are normal.  Neck: Neck supple. No thyromegaly present.  Cardiovascular: Normal rate and regular rhythm.  Respiratory: Effort normal and breath sounds normal.  GI: Soft. Bowel sounds are normal. There is no tenderness.  Musculoskeletal: Normal range of motion.  Neurological: [name] is alert and oriented to person, place, and time.  Skin: Skin is warm and dry.  Psychiatric: [name] has a normal mood and affect.           Assessment & Plan:

## 2018-06-25 NOTE — Patient Instructions (Signed)
Start exercising. You should be getting 150 minutes of moderate intensity exercise weekly.  Continue to eat a healthy diet with plenty of vegetables, fruit, whole grains, lean protein.  Ensure you are consuming 64 ounces of water daily.  We will see you in one year for your annual exam or sooner if needed.  It was a pleasure to see you today!   Preventive Care 18-39 Years, Female Preventive care refers to lifestyle choices and visits with your health care provider that can promote health and wellness. What does preventive care include?  A yearly physical exam. This is also called an annual well check.  Dental exams once or twice a year.  Routine eye exams. Ask your health care provider how often you should have your eyes checked.  Personal lifestyle choices, including: ? Daily care of your teeth and gums. ? Regular physical activity. ? Eating a healthy diet. ? Avoiding tobacco and drug use. ? Limiting alcohol use. ? Practicing safe sex. ? Taking vitamin and mineral supplements as recommended by your health care provider. What happens during an annual well check? The services and screenings done by your health care provider during your annual well check will depend on your age, overall health, lifestyle risk factors, and family history of disease. Counseling Your health care provider may ask you questions about your:  Alcohol use.  Tobacco use.  Drug use.  Emotional well-being.  Home and relationship well-being.  Sexual activity.  Eating habits.  Work and work Statistician.  Method of birth control.  Menstrual cycle.  Pregnancy history.  Screening You may have the following tests or measurements:  Height, weight, and BMI.  Diabetes screening. This is done by checking your blood sugar (glucose) after you have not eaten for a while (fasting).  Blood pressure.  Lipid and cholesterol levels. These may be checked every 5 years starting at age 55.  Skin  check.  Hepatitis C blood test.  Hepatitis B blood test.  Sexually transmitted disease (STD) testing.  BRCA-related cancer screening. This may be done if you have a family history of breast, ovarian, tubal, or peritoneal cancers.  Pelvic exam and Pap test. This may be done every 3 years starting at age 41. Starting at age 59, this may be done every 5 years if you have a Pap test in combination with an HPV test.  Discuss your test results, treatment options, and if necessary, the need for more tests with your health care provider. Vaccines Your health care provider may recommend certain vaccines, such as:  Influenza vaccine. This is recommended every year.  Tetanus, diphtheria, and acellular pertussis (Tdap, Td) vaccine. You may need a Td booster every 10 years.  Varicella vaccine. You may need this if you have not been vaccinated.  HPV vaccine. If you are 74 or younger, you may need three doses over 6 months.  Measles, mumps, and rubella (MMR) vaccine. You may need at least one dose of MMR. You may also need a second dose.  Pneumococcal 13-valent conjugate (PCV13) vaccine. You may need this if you have certain conditions and were not previously vaccinated.  Pneumococcal polysaccharide (PPSV23) vaccine. You may need one or two doses if you smoke cigarettes or if you have certain conditions.  Meningococcal vaccine. One dose is recommended if you are age 47-21 years and a first-year college student living in a residence hall, or if you have one of several medical conditions. You may also need additional booster doses.  Hepatitis A vaccine.  You may need this if you have certain conditions or if you travel or work in places where you may be exposed to hepatitis A.  Hepatitis B vaccine. You may need this if you have certain conditions or if you travel or work in places where you may be exposed to hepatitis B.  Haemophilus influenzae type b (Hib) vaccine. You may need this if you have  certain risk factors.  Talk to your health care provider about which screenings and vaccines you need and how often you need them. This information is not intended to replace advice given to you by your health care provider. Make sure you discuss any questions you have with your health care provider. Document Released: 08/28/2001 Document Revised: 03/21/2016 Document Reviewed: 05/03/2015 Elsevier Interactive Patient Education  [employer].

## 2018-06-25 NOTE — Assessment & Plan Note (Signed)
Denies abdominal pain and symptoms.

## 2018-06-27 MED FILL — NITROFURANTOIN MONO-MCR 100: 100 | 5 days supply | Qty: 10 | Fill #0

## 2018-08-01 NOTE — Telephone Encounter (Signed)
Noted. Sent letter through [facility] for patient to print out

## 2018-08-01 NOTE — Telephone Encounter (Signed)
Can you help?

## 2018-08-05 DIAGNOSIS — Z01419 Encounter for gynecological examination (general) (routine) without abnormal findings: Secondary | ICD-10-CM | POA: Diagnosis not present

## 2018-08-05 DIAGNOSIS — Z6824 Body mass index (BMI) 24.0-24.9, adult: Secondary | ICD-10-CM | POA: Diagnosis not present

## 2018-08-05 DIAGNOSIS — Z1151 Encounter for screening for human papillomavirus (HPV): Secondary | ICD-10-CM | POA: Diagnosis not present

## 2018-09-23 DIAGNOSIS — H5211 Myopia, right eye: Secondary | ICD-10-CM | POA: Diagnosis not present

## 2018-09-23 DIAGNOSIS — H52223 Regular astigmatism, bilateral: Secondary | ICD-10-CM | POA: Diagnosis not present

## 2019-06-30 ENCOUNTER — Encounter: Payer: 59 | Admitting: Primary Care

## 2019-07-03 ENCOUNTER — Other Ambulatory Visit: Payer: Self-pay

## 2019-07-03 ENCOUNTER — Ambulatory Visit (INDEPENDENT_AMBULATORY_CARE_PROVIDER_SITE_OTHER): Payer: 59 | Admitting: Primary Care

## 2019-07-03 ENCOUNTER — Encounter: Payer: Self-pay | Admitting: Primary Care

## 2019-07-03 VITALS — BP 100/68 | HR 88 | Temp 97.6°F | Ht 64.0 in | Wt 141.2 lb

## 2019-07-03 DIAGNOSIS — Z0000 Encounter for general adult medical examination without abnormal findings: Secondary | ICD-10-CM | POA: Diagnosis not present

## 2019-07-03 DIAGNOSIS — D696 Thrombocytopenia, unspecified: Secondary | ICD-10-CM | POA: Diagnosis not present

## 2019-07-03 LAB — CBC
HCT: 42.6 % (ref 36.0–46.0)
Hemoglobin: 14.6 g/dL (ref 12.0–15.0)
MCHC: 34.3 g/dL (ref 30.0–36.0)
MCV: 93.6 fl (ref 78.0–100.0)
Platelets: 158 10*3/uL (ref 150.0–400.0)
RBC: 4.55 Mil/uL (ref 3.87–5.11)
RDW: 12.1 % (ref 11.5–15.5)
WBC: 4.6 10*3/uL (ref 4.0–10.5)

## 2019-07-03 LAB — COMPREHENSIVE METABOLIC PANEL
ALT: 16 U/L (ref 0–35)
AST: 15 U/L (ref 0–37)
Albumin: 4.6 g/dL (ref 3.5–5.2)
Alkaline Phosphatase: 67 U/L (ref 39–117)
BUN: 16 mg/dL (ref 6–23)
CO2: 28 mEq/L (ref 19–32)
Calcium: 9.6 mg/dL (ref 8.4–10.5)
Chloride: 103 mEq/L (ref 96–112)
Creatinine, Ser: 0.67 mg/dL (ref 0.40–1.20)
GFR: 99.68 mL/min (ref >60.00)
Glucose, Bld: 88 mg/dL (ref 70–99)
Potassium: 4.2 mEq/L (ref 3.5–5.1)
Sodium: 135 mEq/L (ref 135–145)
Total Bilirubin: 0.7 mg/dL (ref 0.2–1.2)
Total Protein: 7.3 g/dL (ref 6.0–8.3)

## 2019-07-03 LAB — LIPID PANEL
Cholesterol: 143 mg/dL (ref 0–200)
HDL: 65.6 mg/dL (ref >39.00)
LDL Cholesterol: 70 mg/dL (ref 0–99)
NonHDL: 77.28
Total CHOL/HDL Ratio: 2
Triglycerides: 36 mg/dL (ref 0.0–149.0)
VLDL: 7.2 mg/dL (ref 0.0–40.0)

## 2019-07-03 NOTE — Assessment & Plan Note (Signed)
Repeat CBC pending. 

## 2019-07-03 NOTE — Patient Instructions (Signed)
Stop by the lab prior to leaving today. I will notify you of your results once received.   Start exercising. You should be getting 150 minutes of moderate intensity exercise weekly.  Continue to work on a healthy diet. Ensure you are consuming 64 ounces of water daily.  It was a pleasure to see you today!   Preventive Care 17-35 Years Old, Female Preventive care refers to visits with your health care provider and lifestyle choices that can promote health and wellness. This includes:  A yearly physical exam. This may also be called an annual well check.  Regular dental visits and eye exams.  Immunizations.  Screening for certain conditions.  Healthy lifestyle choices, such as eating a healthy diet, getting regular exercise, not using drugs or products that contain nicotine and tobacco, and limiting alcohol use. What can I expect for my preventive care visit? Physical exam Your health care provider will check your:  Height and weight. This may be used to calculate body mass index (BMI), which tells if you are at a healthy weight.  Heart rate and blood pressure.  Skin for abnormal spots. Counseling Your health care provider may ask you questions about your:  Alcohol, tobacco, and drug use.  Emotional well-being.  Home and relationship well-being.  Sexual activity.  Eating habits.  Work and work Statistician.  Method of birth control.  Menstrual cycle.  Pregnancy history. What immunizations do I need?  Influenza (flu) vaccine  This is recommended every year. Tetanus, diphtheria, and pertussis (Tdap) vaccine  You may need a Td booster every 10 years. Varicella (chickenpox) vaccine  You may need this if you have not been vaccinated. Human papillomavirus (HPV) vaccine  If recommended by your health care provider, you may need three doses over 6 months. Measles, mumps, and rubella (MMR) vaccine  You may need at least one dose of MMR. You may also need a second  dose. Meningococcal conjugate (MenACWY) vaccine  One dose is recommended if you are age 35-21 years and a first-year college student living in a residence hall, or if you have one of several medical conditions. You may also need additional booster doses. Pneumococcal conjugate (PCV13) vaccine  You may need this if you have certain conditions and were not previously vaccinated. Pneumococcal polysaccharide (PPSV23) vaccine  You may need one or two doses if you smoke cigarettes or if you have certain conditions. Hepatitis A vaccine  You may need this if you have certain conditions or if you travel or work in places where you may be exposed to hepatitis A. Hepatitis B vaccine  You may need this if you have certain conditions or if you travel or work in places where you may be exposed to hepatitis B. Haemophilus influenzae type b (Hib) vaccine  You may need this if you have certain conditions. You may receive vaccines as individual doses or as more than one vaccine together in one shot (combination vaccines). Talk with your health care provider about the risks and benefits of combination vaccines. What tests do I need?  Blood tests  Lipid and cholesterol levels. These may be checked every 5 years starting at age 11.  Hepatitis C test.  Hepatitis B test. Screening  Diabetes screening. This is done by checking your blood sugar (glucose) after you have not eaten for a while (fasting).  Sexually transmitted disease (STD) testing.  BRCA-related cancer screening. This may be done if you have a family history of breast, ovarian, tubal, or peritoneal cancers.  Pelvic exam and Pap test. This may be done every 3 years starting at age 21. Starting at age 30, this may be done every 5 years if you have a Pap test in combination with an HPV test. Talk with your health care provider about your test results, treatment options, and if necessary, the need for more tests. Follow these instructions at  home: Eating and drinking   Eat a diet that includes fresh fruits and vegetables, whole grains, lean protein, and low-fat dairy.  Take vitamin and mineral supplements as recommended by your health care provider.  Do not drink alcohol if: ? Your health care provider tells you not to drink. ? You are pregnant, may be pregnant, or are planning to become pregnant.  If you drink alcohol: ? Limit how much you have to 0-1 drink a day. ? Be aware of how much alcohol is in your drink. In the [place] one drink equals one 12 oz bottle of beer (355 mL), one 5 oz glass of wine (148 mL), or one 1 oz glass of hard liquor (44 mL). Lifestyle  Take daily care of your teeth and gums.  Stay active. Exercise for at least 30 minutes on 5 or more days each week.  Do not use any products that contain nicotine or tobacco, such as cigarettes, e-cigarettes, and chewing tobacco. If you need help quitting, ask your health care provider.  If you are sexually active, practice safe sex. Use a condom or other form of birth control (contraception) in order to prevent pregnancy and STIs (sexually transmitted infections). If you plan to become pregnant, see your health care provider for a preconception visit. What's next?  Visit your health care provider once a year for a well check visit.  Ask your health care provider how often you should have your eyes and teeth checked.  Stay up to date on all vaccines. This information is not intended to replace advice given to you by your health care provider. Make sure you discuss any questions you have with your health care provider. Document Released: 08/28/2001 Document Revised: 03/13/2018 Document Reviewed: 03/13/2018 Elsevier Patient Education  2020 Elsevier Inc.  

## 2019-07-03 NOTE — Assessment & Plan Note (Signed)
Immunizations UTD. Pap smear UTD, follows with GYN. Encouraged a healthy diet and regular exercise. Exam today unremarkable. Labs pending. 

## 2019-07-03 NOTE — Progress Notes (Signed)
Subjective:    Patient ID: [name], female    DOB: 04/18/1986, 33 y.o.   MRN: [id number]  HPI  This visit occurred during the SARS-CoV-2 public health emergency.  Safety protocols were in place, including screening questions prior to the visit, additional usage of staff PPE, and extensive cleaning of exam room while observing appropriate contact time as indicated for disinfecting solutions.   [name] is a 33 year old female who presents today for complete physical.  Immunizations: -Tetanus: Completed in 2013 -Influenza: Completed this season   Diet: She endorses a fair diet. Exercise: She is not exercising much.   Eye exam: Completed in 2020 Dental exam: Completed in 2020  Pap Smear: UTD, follows with GYN.  BP Readings from Last 3 Encounters:  07/03/19 100/68  06/25/18 98/68  02/13/18 106/70     Review of Systems  Constitutional: Negative for unexpected weight change.  HENT: Negative for rhinorrhea.   Respiratory: Negative for cough and shortness of breath.   Cardiovascular: Negative for chest pain.  Gastrointestinal: Negative for constipation and diarrhea.  Genitourinary: Negative for difficulty urinating and menstrual problem.  Musculoskeletal: Negative for arthralgias and myalgias.  Skin: Negative for rash.  Allergic/Immunologic: Positive for environmental allergies.  Neurological: Negative for dizziness, numbness and headaches.  Psychiatric/Behavioral: The patient is not nervous/anxious.        Past Medical History:  Diagnosis Date  . Acute heart failure ([place]) 2009   With pregnancy  . Chicken pox   . History of UTI   . Maternal anemia, with delivery 11/27/2011  . PP care - s/p rC/S 5/13 11/26/2011  . Pre-eclampsia 2009   first pregnancy  . Thrombocytopenia ([place]) 11/27/2011     Social History   Socioeconomic History  . Marital status: Married    Spouse name: Not on file  . Number of children: Not on file  . Years of education: Not on file  .  Highest education level: Not on file  Occupational History  . Not on file  Tobacco Use  . Smoking status: Never Smoker  . Smokeless tobacco: Never Used  Substance and Sexual Activity  . Alcohol use: Yes    Comment: socially  . Drug use: No  . Sexual activity: Yes  Other Topics Concern  . Not on file  Social History Narrative   Married.   2 children.   Works as a Marine scientist at [employer].    Enjoys spending time with her family, decorate.   Social Determinants of Health   Financial Resource Strain:   . Difficulty of Paying Living Expenses: Not on file  Food Insecurity:   . Worried About Charity fundraiser in the Last Year: Not on file  . Ran Out of Food in the Last Year: Not on file  Transportation Needs:   . Lack of Transportation (Medical): Not on file  . Lack of Transportation (Non-Medical): Not on file  Physical Activity:   . Days of Exercise per Week: Not on file  . Minutes of Exercise per Session: Not on file  Stress:   . Feeling of Stress : Not on file  Social Connections:   . Frequency of Communication with Friends and Family: Not on file  . Frequency of Social Gatherings with Friends and Family: Not on file  . Attends Religious Services: Not on file  . Active Member of Clubs or Organizations: Not on file  . Attends Archivist Meetings: Not on file  . Marital  Status: Not on file  Intimate Partner Violence:   . Fear of Current or Ex-Partner: Not on file  . Emotionally Abused: Not on file  . Physically Abused: Not on file  . Sexually Abused: Not on file    Past Surgical History:  Procedure Laterality Date  . CESAREAN SECTION    . CESAREAN SECTION  11/26/2011   Procedure: CESAREAN SECTION;  Surgeon: Marvene Staff, MD;  Location: [facility];  Service: Gynecology;  Laterality: N/A;  EDD: 11/25/11    Family History  Problem Relation Age of Onset  . Prostate cancer Father   . Hyperlipidemia Father   . Hypertension Father   . Breast cancer Paternal  Grandmother   . Diabetes Paternal Grandmother   . Diabetes Paternal Grandfather     Allergies  Allergen Reactions  . Penicillins Hives    Childhood reaction    Current Outpatient Medications on File Prior to Visit  Medication Sig Dispense Refill  . acetaminophen (TYLENOL) 325 MG tablet Take 650 mg by mouth daily as needed. headache    . cetirizine (ZYRTEC) 10 MG tablet Take 10 mg by mouth daily.    Marland Kitchen ibuprofen (ADVIL,MOTRIN) 200 MG tablet Take 200 mg by mouth every 6 (six) hours as needed.    Marland Kitchen levonorgestrel (MIRENA) 20 MCG/24HR IUD 1 each by Intrauterine route once.     No current facility-administered medications on file prior to visit.    BP 100/68   Pulse 88   Temp 97.6 F (36.4 C) (Temporal)   Ht 5\' 4"  (1.626 m)   Wt 141 lb 4 oz (64.1 kg)   SpO2 97%   BMI 24.25 kg/m    Objective:   Physical Exam  Constitutional: She is oriented to person, place, and time. She appears well-nourished.  HENT:  Right Ear: Tympanic membrane and ear canal normal.  Left Ear: Tympanic membrane and ear canal normal.  Mouth/Throat: Oropharynx is clear and moist.  Eyes: Pupils are equal, round, and reactive to light. EOM are normal.  Cardiovascular: Normal rate and regular rhythm.  Respiratory: Effort normal and breath sounds normal.  GI: Soft. Bowel sounds are normal. There is no abdominal tenderness.  Musculoskeletal:        General: Normal range of motion.     Cervical back: Neck supple.  Neurological: She is alert and oriented to person, place, and time. No cranial nerve deficit.  Reflex Scores:      Patellar reflexes are 2+ on the right side and 2+ on the left side. Skin: Skin is warm and dry.  Psychiatric: She has a normal mood and affect.           Assessment & Plan:

## 2019-08-07 NOTE — Telephone Encounter (Signed)
Noted. Faxed request to [facility] for the report.

## 2019-08-11 ENCOUNTER — Encounter: Payer: Self-pay | Admitting: Primary Care

## 2019-09-24 DIAGNOSIS — Z6824 Body mass index (BMI) 24.0-24.9, adult: Secondary | ICD-10-CM | POA: Diagnosis not present

## 2019-09-24 DIAGNOSIS — Z01419 Encounter for gynecological examination (general) (routine) without abnormal findings: Secondary | ICD-10-CM | POA: Diagnosis not present

## 2019-09-24 DIAGNOSIS — Z1151 Encounter for screening for human papillomavirus (HPV): Secondary | ICD-10-CM | POA: Diagnosis not present

## 2019-09-28 DIAGNOSIS — H52223 Regular astigmatism, bilateral: Secondary | ICD-10-CM | POA: Diagnosis not present

## 2020-06-17 ENCOUNTER — Other Ambulatory Visit: Payer: Self-pay | Admitting: Primary Care

## 2020-06-17 DIAGNOSIS — D696 Thrombocytopenia, unspecified: Secondary | ICD-10-CM

## 2020-06-17 DIAGNOSIS — Z0000 Encounter for general adult medical examination without abnormal findings: Secondary | ICD-10-CM

## 2020-06-27 ENCOUNTER — Other Ambulatory Visit: Payer: Self-pay

## 2020-06-27 ENCOUNTER — Other Ambulatory Visit (INDEPENDENT_AMBULATORY_CARE_PROVIDER_SITE_OTHER): Payer: 59

## 2020-06-27 DIAGNOSIS — Z0000 Encounter for general adult medical examination without abnormal findings: Secondary | ICD-10-CM

## 2020-06-27 DIAGNOSIS — D696 Thrombocytopenia, unspecified: Secondary | ICD-10-CM | POA: Diagnosis not present

## 2020-06-27 LAB — CBC
HCT: 39.2 % (ref 36.0–46.0)
Hemoglobin: 13.5 g/dL (ref 12.0–15.0)
MCHC: 34.5 g/dL (ref 30.0–36.0)
MCV: 92.3 fl (ref 78.0–100.0)
Platelets: 169 10*3/uL (ref 150.0–400.0)
RBC: 4.25 Mil/uL (ref 3.87–5.11)
RDW: 12.2 % (ref 11.5–15.5)
WBC: 4.4 10*3/uL (ref 4.0–10.5)

## 2020-06-27 LAB — LIPID PANEL
Cholesterol: 114 mg/dL (ref 0–200)
HDL: 53.6 mg/dL (ref >39.00)
LDL Cholesterol: 54 mg/dL (ref 0–99)
NonHDL: 60.39
Total CHOL/HDL Ratio: 2
Triglycerides: 32 mg/dL (ref 0.0–149.0)
VLDL: 6.4 mg/dL (ref 0.0–40.0)

## 2020-06-27 LAB — COMPREHENSIVE METABOLIC PANEL
ALT: 14 U/L (ref 0–35)
AST: 15 U/L (ref 0–37)
Albumin: 4 g/dL (ref 3.5–5.2)
Alkaline Phosphatase: 79 U/L (ref 39–117)
BUN: 12 mg/dL (ref 6–23)
CO2: 27 mEq/L (ref 19–32)
Calcium: 8.8 mg/dL (ref 8.4–10.5)
Chloride: 104 mEq/L (ref 96–112)
Creatinine, Ser: 0.65 mg/dL (ref 0.40–1.20)
GFR: 112.94 mL/min (ref >60.00)
Glucose, Bld: 83 mg/dL (ref 70–99)
Potassium: 4.1 mEq/L (ref 3.5–5.1)
Sodium: 139 mEq/L (ref 135–145)
Total Bilirubin: 0.4 mg/dL (ref 0.2–1.2)
Total Protein: 6.7 g/dL (ref 6.0–8.3)

## 2020-07-04 ENCOUNTER — Ambulatory Visit (INDEPENDENT_AMBULATORY_CARE_PROVIDER_SITE_OTHER): Payer: 59 | Admitting: Primary Care

## 2020-07-04 ENCOUNTER — Other Ambulatory Visit: Payer: Self-pay

## 2020-07-04 ENCOUNTER — Encounter: Payer: Self-pay | Admitting: Primary Care

## 2020-07-04 VITALS — BP 100/84 | HR 74 | Temp 97.6°F | Ht 64.0 in | Wt 153.0 lb

## 2020-07-04 DIAGNOSIS — R5383 Other fatigue: Secondary | ICD-10-CM | POA: Insufficient documentation

## 2020-07-04 DIAGNOSIS — Z0000 Encounter for general adult medical examination without abnormal findings: Secondary | ICD-10-CM | POA: Diagnosis not present

## 2020-07-04 DIAGNOSIS — D696 Thrombocytopenia, unspecified: Secondary | ICD-10-CM | POA: Diagnosis not present

## 2020-07-04 LAB — VITAMIN D 25 HYDROXY (VIT D DEFICIENCY, FRACTURES): VITD: 27.72 ng/mL — ABNORMAL LOW (ref 30.00–100.00)

## 2020-07-04 LAB — VITAMIN B12: Vitamin B-12: 241 pg/mL (ref 211–911)

## 2020-07-04 LAB — TSH: TSH: 1.54 u[IU]/mL (ref 0.35–4.50)

## 2020-07-04 NOTE — Patient Instructions (Signed)
Stop by the lab prior to leaving today. I will notify you of your results once received.   Start exercising. You should be getting 150 minutes of moderate intensity exercise weekly.  Continue to work on a healthy diet. Ensure you are consuming 64 ounces of water daily.  It was a pleasure to see you today!   Preventive Care 17-36 Years Old, Female Preventive care refers to visits with your health care provider and lifestyle choices that can promote health and wellness. This includes:  A yearly physical exam. This may also be called an annual well check.  Regular dental visits and eye exams.  Immunizations.  Screening for certain conditions.  Healthy lifestyle choices, such as eating a healthy diet, getting regular exercise, not using drugs or products that contain nicotine and tobacco, and limiting alcohol use. What can I expect for my preventive care visit? Physical exam Your health care provider will check your:  Height and weight. This may be used to calculate body mass index (BMI), which tells if you are at a healthy weight.  Heart rate and blood pressure.  Skin for abnormal spots. Counseling Your health care provider may ask you questions about your:  Alcohol, tobacco, and drug use.  Emotional well-being.  Home and relationship well-being.  Sexual activity.  Eating habits.  Work and work Statistician.  Method of birth control.  Menstrual cycle.  Pregnancy history. What immunizations do I need?  Influenza (flu) vaccine  This is recommended every year. Tetanus, diphtheria, and pertussis (Tdap) vaccine  You may need a Td booster every 10 years. Varicella (chickenpox) vaccine  You may need this if you have not been vaccinated. Human papillomavirus (HPV) vaccine  If recommended by your health care provider, you may need three doses over 6 months. Measles, mumps, and rubella (MMR) vaccine  You may need at least one dose of MMR. You may also need a second  dose. Meningococcal conjugate (MenACWY) vaccine  One dose is recommended if you are age 36-21 years and a first-year college student living in a residence hall, or if you have one of several medical conditions. You may also need additional booster doses. Pneumococcal conjugate (PCV13) vaccine  You may need this if you have certain conditions and were not previously vaccinated. Pneumococcal polysaccharide (PPSV23) vaccine  You may need one or two doses if you smoke cigarettes or if you have certain conditions. Hepatitis A vaccine  You may need this if you have certain conditions or if you travel or work in places where you may be exposed to hepatitis A. Hepatitis B vaccine  You may need this if you have certain conditions or if you travel or work in places where you may be exposed to hepatitis B. Haemophilus influenzae type b (Hib) vaccine  You may need this if you have certain conditions. You may receive vaccines as individual doses or as more than one vaccine together in one shot (combination vaccines). Talk with your health care provider about the risks and benefits of combination vaccines. What tests do I need?  Blood tests  Lipid and cholesterol levels. These may be checked every 5 years starting at age 11.  Hepatitis C test.  Hepatitis B test. Screening  Diabetes screening. This is done by checking your blood sugar (glucose) after you have not eaten for a while (fasting).  Sexually transmitted disease (STD) testing.  BRCA-related cancer screening. This may be done if you have a family history of breast, ovarian, tubal, or peritoneal cancers.  Pelvic exam and Pap test. This may be done every 3 years starting at age 21. Starting at age 30, this may be done every 5 years if you have a Pap test in combination with an HPV test. Talk with your health care provider about your test results, treatment options, and if necessary, the need for more tests. Follow these instructions at  home: Eating and drinking   Eat a diet that includes fresh fruits and vegetables, whole grains, lean protein, and low-fat dairy.  Take vitamin and mineral supplements as recommended by your health care provider.  Do not drink alcohol if: ? Your health care provider tells you not to drink. ? You are pregnant, may be pregnant, or are planning to become pregnant.  If you drink alcohol: ? Limit how much you have to 0-1 drink a day. ? Be aware of how much alcohol is in your drink. In the [place] one drink equals one 12 oz bottle of beer (355 mL), one 5 oz glass of wine (148 mL), or one 1 oz glass of hard liquor (44 mL). Lifestyle  Take daily care of your teeth and gums.  Stay active. Exercise for at least 30 minutes on 5 or more days each week.  Do not use any products that contain nicotine or tobacco, such as cigarettes, e-cigarettes, and chewing tobacco. If you need help quitting, ask your health care provider.  If you are sexually active, practice safe sex. Use a condom or other form of birth control (contraception) in order to prevent pregnancy and STIs (sexually transmitted infections). If you plan to become pregnant, see your health care provider for a preconception visit. What's next?  Visit your health care provider once a year for a well check visit.  Ask your health care provider how often you should have your eyes and teeth checked.  Stay up to date on all vaccines. This information is not intended to replace advice given to you by your health care provider. Make sure you discuss any questions you have with your health care provider. Document Revised: 03/13/2018 Document Reviewed: 03/13/2018 Elsevier Patient Education  2020 Elsevier Inc.  

## 2020-07-04 NOTE — Progress Notes (Signed)
Subjective:    Patient ID: [name], female    DOB: 1986/05/12, 34 y.o.   MRN: [id number]  HPI  This visit occurred during the SARS-CoV-2 public health emergency.  Safety protocols were in place, including screening questions prior to the visit, additional usage of staff PPE, and extensive cleaning of exam room while observing appropriate contact time as indicated for disinfecting solutions.   [name] is a 34 year old female who presents today for complete physical.   She has also noticed difficulty losing weight with fatigue. She's gained 12 pounds over the last year, isn't really sure how. She's been eating a healthy diet, remains active at work and exercises regularly. She denies an immediate family history of thyroid disorder.   Immunizations: -Tetanus: Completed in 2013 -Influenza: Completed series  -Covid-19: Completed series  Diet: She endorses a fair diet.  Exercise: She is exercising several days weekly.   Eye exam: Completes annually  Dental exam: Completes semi-annually   Pap Smear: Completed in 2020  BP Readings from Last 3 Encounters:  07/03/19 100/68  06/25/18 98/68  02/13/18 106/70   Wt Readings from Last 3 Encounters:  07/04/20 153 lb (69.4 kg)  07/03/19 141 lb 4 oz (64.1 kg)  06/25/18 138 lb 8 oz (62.8 kg)     Review of Systems  Constitutional: Negative for unexpected weight change.  HENT: Negative for rhinorrhea.   Eyes: Negative for visual disturbance.  Respiratory: Negative for cough and shortness of breath.   Cardiovascular: Negative for chest pain.  Gastrointestinal: Negative for constipation and diarrhea.  Genitourinary: Negative for difficulty urinating and menstrual problem.  Musculoskeletal: Negative for arthralgias and myalgias.  Skin: Negative for rash.  Allergic/Immunologic: Negative for environmental allergies.  Neurological: Negative for dizziness and headaches.  Psychiatric/Behavioral: The patient is not nervous/anxious.         Past Medical History:  Diagnosis Date  . Acute heart failure ([place]) 2009   With pregnancy  . Chicken pox   . History of UTI   . Maternal anemia, with delivery 11/27/2011  . PP care - s/p rC/S 5/13 11/26/2011  . Pre-eclampsia 2009   first pregnancy  . Thrombocytopenia ([place]) 11/27/2011     Social History   Socioeconomic History  . Marital status: Married    Spouse name: Not on file  . Number of children: Not on file  . Years of education: Not on file  . Highest education level: Not on file  Occupational History  . Not on file  Tobacco Use  . Smoking status: Never Smoker  . Smokeless tobacco: Never Used  Substance and Sexual Activity  . Alcohol use: Yes    Comment: socially  . Drug use: No  . Sexual activity: Yes  Other Topics Concern  . Not on file  Social History Narrative   Married.   2 children.   Works as a Marine scientist at [employer].    Enjoys spending time with her family, decorate.   Social Determinants of Health   Financial Resource Strain: Not on file  Food Insecurity: Not on file  Transportation Needs: Not on file  Physical Activity: Not on file  Stress: Not on file  Social Connections: Not on file  Intimate Partner Violence: Not on file    Past Surgical History:  Procedure Laterality Date  . CESAREAN SECTION    . CESAREAN SECTION  11/26/2011   Procedure: CESAREAN SECTION;  Surgeon: Marvene Staff, MD;  Location: [facility];  Service:  Gynecology;  Laterality: N/A;  EDD: 11/25/11    Family History  Problem Relation Age of Onset  . Prostate cancer Father   . Hyperlipidemia Father   . Hypertension Father   . Breast cancer Paternal Grandmother   . Diabetes Paternal Grandmother   . Diabetes Paternal Grandfather     Allergies  Allergen Reactions  . Penicillins Hives    Childhood reaction    Current Outpatient Medications on File Prior to Visit  Medication Sig Dispense Refill  . acetaminophen (TYLENOL) 325 MG tablet Take 650 mg by mouth daily  as needed. headache    . cetirizine (ZYRTEC) 10 MG tablet Take 10 mg by mouth daily.    Marland Kitchen ibuprofen (ADVIL,MOTRIN) 200 MG tablet Take 200 mg by mouth every 6 (six) hours as needed.    Marland Kitchen levonorgestrel (MIRENA) 20 MCG/24HR IUD 1 each by Intrauterine route once.     No current facility-administered medications on file prior to visit.    BP 100/84   Pulse 74   Temp 97.6 F (36.4 C) (Temporal)   Ht 5\' 4"  (1.626 m)   Wt 153 lb (69.4 kg)   SpO2 98%   BMI 26.26 kg/m    Objective:   Physical Exam Constitutional:      Appearance: She is well-nourished.  HENT:     Right Ear: Tympanic membrane and ear canal normal.     Left Ear: Tympanic membrane and ear canal normal.     Mouth/Throat:     Mouth: Oropharynx is clear and moist.  Eyes:     Extraocular Movements: EOM normal.     Pupils: Pupils are equal, round, and reactive to light.  Cardiovascular:     Rate and Rhythm: Normal rate and regular rhythm.  Pulmonary:     Effort: Pulmonary effort is normal.     Breath sounds: Normal breath sounds.  Abdominal:     General: Bowel sounds are normal.     Palpations: Abdomen is soft.     Tenderness: There is no abdominal tenderness.  Musculoskeletal:        General: Normal range of motion.     Cervical back: Neck supple.  Skin:    General: Skin is warm and dry.  Neurological:     Mental Status: She is alert and oriented to person, place, and time.     Cranial Nerves: No cranial nerve deficit.     Deep Tendon Reflexes:     Reflex Scores:      Patellar reflexes are 2+ on the right side and 2+ on the left side. Psychiatric:        Mood and Affect: Mood and affect and mood normal.            Assessment & Plan:

## 2020-07-04 NOTE — Assessment & Plan Note (Signed)
Resolved. Continue to monitor annually.

## 2020-07-04 NOTE — Assessment & Plan Note (Signed)
Chronic, is under a lot of stress at work which could be contributing. She denies depression.   Checking labs today including TSH, vitamin B12 and D. Await results.

## 2020-07-04 NOTE — Assessment & Plan Note (Signed)
Immunizations UTD. Pap smear UTD.  Discussed the importance of a healthy diet and regular exercise in order for weight loss, and to reduce the risk of any potential medical problems.  Exam today unremarkable. Labs reviewed and pending.

## 2020-07-21 ENCOUNTER — Other Ambulatory Visit: Payer: Self-pay

## 2020-07-21 ENCOUNTER — Ambulatory Visit: Payer: 59 | Admitting: Dermatology

## 2020-07-21 ENCOUNTER — Encounter: Payer: Self-pay | Admitting: Dermatology

## 2020-07-21 DIAGNOSIS — Z86018 Personal history of other benign neoplasm: Secondary | ICD-10-CM

## 2020-07-21 DIAGNOSIS — D229 Melanocytic nevi, unspecified: Secondary | ICD-10-CM | POA: Diagnosis not present

## 2020-07-21 DIAGNOSIS — D1800 Hemangioma unspecified site: Secondary | ICD-10-CM

## 2020-07-21 DIAGNOSIS — Z1283 Encounter for screening for malignant neoplasm of skin: Secondary | ICD-10-CM

## 2020-07-21 DIAGNOSIS — L811 Chloasma: Secondary | ICD-10-CM | POA: Diagnosis not present

## 2020-07-21 DIAGNOSIS — L821 Other seborrheic keratosis: Secondary | ICD-10-CM

## 2020-07-21 DIAGNOSIS — L814 Other melanin hyperpigmentation: Secondary | ICD-10-CM

## 2020-07-21 DIAGNOSIS — L578 Other skin changes due to chronic exposure to nonionizing radiation: Secondary | ICD-10-CM

## 2020-07-21 DIAGNOSIS — L719 Rosacea, unspecified: Secondary | ICD-10-CM | POA: Diagnosis not present

## 2020-07-21 NOTE — Patient Instructions (Addendum)
Melanoma ABCDEs  Melanoma is the most dangerous type of skin cancer, and is the leading cause of death from skin disease.  You are more likely to develop melanoma if you:  Have light-colored skin, light-colored eyes, or red or blond hair  Spend a lot of time in the sun  Tan regularly, either outdoors or in a tanning bed  Have had blistering sunburns, especially during childhood  Have a close family member who has had a melanoma  Have atypical moles or large birthmarks  Early detection of melanoma is key since treatment is typically straightforward and cure rates are extremely high if we catch it early.   The first sign of melanoma is often a change in a mole or a new dark spot.  The ABCDE system is a way of remembering the signs of melanoma.  A for asymmetry:  The two halves do not match. B for border:  The edges of the growth are irregular. C for color:  A mixture of colors are present instead of an even brown color. D for diameter:  Melanomas are usually (but not always) greater than 46mm - the size of a pencil eraser. E for evolution:  The spot keeps changing in size, shape, and color.  Please check your skin once per month between visits. You can use a small mirror in front and a large mirror behind you to keep an eye on the back side or your body.   If you see any new or changing lesions before your next follow-up, please call to schedule a visit.  Please continue daily skin protection including broad spectrum sunscreen SPF 50+ to sun-exposed areas, reapplying every 2 hours as needed when you're outdoors. Used tinted sun screen on face.   Melasma is a condition of persistent pigmented patches generally on the face, worse in summer due to higher UV exposure.  Oral estrogen containing BCPs or supplements can exacerbate condition.  Recommend daily broad spectrum tinted sunscreen SPF 30+ to face, preferably with Zinc or Titanium Dioxide. Discussed Rx topical bleaching creams (i.e.  hydroquinone), OTC HelioCare supplement, chemical peels (would need multiple for best result).    Recommend taking Heliocare sun protection supplement daily in sunny weather for additional sun protection. For maximum protection on the sunniest days, you can take up to 2 capsules of regular Heliocare OR take 1 capsule of Heliocare Ultra. For prolonged exposure (such as a full day in the sun), you can repeat your dose of the supplement 4 hours after your first dose. Heliocare can be purchased at [facility] or at [email or web address].   Instructions for [facility] Medications  One or more of your medications was sent to the [facility] mail order compounding pharmacy. You will receive an email from them and can purchase the medicine through that link. It will then be mailed to your home at the address you confirmed. If for any reason you do not receive an email from them, please check your spam folder. If you still do not find the email, please let us know. [facility] phone number is [phone].

## 2020-07-21 NOTE — Progress Notes (Signed)
New Patient Visit  Subjective  [name] is a 35 y.o. female who presents for the following: TBSE (Patient here for full body skin exam and skin cancer screening. She reports she has had several spots removed in past but no history of skin cancer. She report a spot on left side of torso and place under left eye she would like examined.  She also notes a red area at her nose she would like checked.).  She is also bothered by dark areas on the face that get worse during the summers.  Objective  Well appearing patient in no apparent distress; mood and affect are within normal limits.  A full examination was performed including scalp, head, eyes, ears, nose, lips, neck, chest, axillae, abdomen, back, buttocks, bilateral upper extremities, bilateral lower extremities, hands, feet, fingers, toes, fingernails, and toenails. All findings within normal limits unless otherwise noted below.  Objective  Head - Anterior (Face): Erythema of nose and mid face   Objective  cheeks and upper lip: Reticulated hyperpigmented patches.   Objective  Left Flank, Pubic: scars  Assessment & Plan  Rosacea Head - Anterior (Face)  Reassured benign.  Rosacea is a chronic progressive skin condition usually affecting the face of adults, causing redness and/or acne bumps. It is treatable but not curable. It sometimes affects the eyes (ocular rosacea) as well. It may respond to topical and/or systemic medication and can flare with stress, sun exposure, alcohol, exercise and some foods.  Daily application of broad spectrum spf 30+ sunscreen to face is recommended to reduce flares.   Has some dry eye but denies gritty sensation. Recommend asking ophthalmology if they see signs of occular rosacea at her next exam.  Discussed rhofade prescription and BBL light treatment. Deferred at this time.     Melasma cheeks and upper lip  Chronic condition with duration over one year. No cure, only control.  Condition is bothersome to patient. Currently flared.  Will prescribe [facility] Hydroquinone 12%/kojic acid/vitamin C cream pea sized amount twice daily to the entire face for up to 3 months. This cannot be used more than 3 months due to risk of exogenous ochronosis (permanent dark spots). The patient was advised this is not covered by insurance since it is made by a compounding pharmacy. They will receive an email to check out and the medication will be mailed to their home.    Melasma is a condition of persistent pigmented patches generally on the face, worse in summer due to higher UV exposure.  Oral estrogen containing BCPs or supplements can exacerbate condition.  Recommend daily broad spectrum tinted sunscreen SPF 30+ to face, preferably with Zinc or Titanium Dioxide. Discussed Rx topical bleaching creams (i.e. hydroquinone), OTC HelioCare supplement, chemical peels (would need multiple for best result).    Recommend taking Heliocare sun protection supplement daily in sunny weather for additional sun protection. For maximum protection on the sunniest days, you can take up to 2 capsules of regular Heliocare OR take 1 capsule of Heliocare Ultra. For prolonged exposure (such as a full day in the sun), you can repeat your dose of the supplement 4 hours after your first dose. Heliocare can be purchased at [facility] or at [email or web address].     History of dysplastic nevus (2) Pubic; Left Flank  History of several places removed which were ok per patient.   One lesion at vaginal area required re excision. Will request records.   Patient deferred exam today. Patient is  being follow by gyn and has follow up scheduled in march.   Recommend regular full body skin exams Recommend daily broad spectrum sunscreen SPF 30+ to sun-exposed areas, reapply every 2 hours as needed.  Call if any new or changing lesions are noted between office visits   Lentigines - Scattered tan macules     0.3 cm dark brown macule - right medial cheek  - Discussed due to sun exposure - Benign, observe - Call for any changes  Seborrheic Keratoses - Stuck-on, waxy, tan-brown papules and plaques  - Discussed benign etiology and prognosis. - Observe - Call for any changes  Melanocytic Nevi - Tan-brown and/or pink-flesh-colored symmetric macules and papules - Benign appearing on exam today - Observation - Call clinic for new or changing moles - Recommend daily use of broad spectrum spf 30+ sunscreen to sun-exposed areas.   Hemangiomas - Red papules - Discussed benign nature - Observe - Call for any changes  Actinic Damage - Chronic, secondary to cumulative UV/sun exposure - diffuse scaly erythematous macules with underlying dyspigmentation - Recommend daily broad spectrum sunscreen SPF 30+ to sun-exposed areas, reapply every 2 hours as needed.  - Call for new or changing lesions.  Skin cancer screening performed today.  Return in about 3 months (around 10/19/2020) for Melasma follow up , 1 year TBSE, as needed .  I, Ruthell Rummage, CMA, am acting as scribe for Forest Gleason, MD.  Documentation: I have reviewed the above documentation for accuracy and completeness, and I agree with the above.  Forest Gleason, MD

## 2020-09-14 ENCOUNTER — Other Ambulatory Visit (HOSPITAL_COMMUNITY): Payer: Self-pay | Admitting: Nurse Practitioner

## 2020-09-14 ENCOUNTER — Telehealth: Payer: 59 | Admitting: Nurse Practitioner

## 2020-09-14 DIAGNOSIS — J0100 Acute maxillary sinusitis, unspecified: Secondary | ICD-10-CM

## 2020-09-14 MED ORDER — DOXYCYCLINE HYCLATE 100 MG PO TABS: 100.0000 mg | ORAL_TABLET | Freq: Two times a day (BID) | ORAL | 0 refills | Status: DC

## 2020-09-14 NOTE — Progress Notes (Signed)

## 2020-09-15 ENCOUNTER — Telehealth: Payer: 59 | Admitting: Family Medicine

## 2020-09-28 DIAGNOSIS — Z30431 Encounter for routine checking of intrauterine contraceptive device: Secondary | ICD-10-CM | POA: Diagnosis not present

## 2020-09-28 DIAGNOSIS — Z01419 Encounter for gynecological examination (general) (routine) without abnormal findings: Secondary | ICD-10-CM | POA: Diagnosis not present

## 2020-09-28 DIAGNOSIS — R635 Abnormal weight gain: Secondary | ICD-10-CM | POA: Diagnosis not present

## 2020-09-28 DIAGNOSIS — R6882 Decreased libido: Secondary | ICD-10-CM | POA: Diagnosis not present

## 2020-10-05 DIAGNOSIS — H5213 Myopia, bilateral: Secondary | ICD-10-CM | POA: Diagnosis not present

## 2020-10-20 ENCOUNTER — Ambulatory Visit: Payer: 59 | Admitting: Dermatology

## 2020-10-20 ENCOUNTER — Other Ambulatory Visit: Payer: Self-pay

## 2020-10-20 DIAGNOSIS — L720 Epidermal cyst: Secondary | ICD-10-CM

## 2020-10-20 DIAGNOSIS — L738 Other specified follicular disorders: Secondary | ICD-10-CM

## 2020-10-20 DIAGNOSIS — L811 Chloasma: Secondary | ICD-10-CM

## 2020-10-20 NOTE — Progress Notes (Signed)
Follow-Up Visit   Subjective  [name] is a 35 y.o. female who presents for the following: follow up (Patient here today for follow up on melasma.  Patient was using   [facility] Hydroquinone 12%/kojic acid/vitamin C creamfrom [facility]. She just completed course of cream. Reports was seeing a difference in areas in the first month of using cream, but after did not see huge difference in areas. ).    She has a couple of spots at her face that are irritated and raised.  The following portions of the chart were reviewed this encounter and updated as appropriate:  Tobacco  Allergies  Meds  Problems  Med Hx  Surg Hx  Fam Hx      Objective  Well appearing patient in no apparent distress; mood and affect are within normal limits.  A focused examination was performed including face. Relevant physical exam findings are noted in the Assessment and Plan.  Objective  cheeks and upper lip: Reticulated hyperpigmented patches.   Objective  left temple: Small yellow papules with a central dell.   Objective  Right vermillion border, right temple: Smooth white papules  Assessment & Plan  Melasma cheeks and upper lip  Melasma is a condition of persistent pigmented patches generally on the face, worse in summer due to higher UV exposure.  Oral estrogen containing BCPs or supplements can exacerbate condition.  Recommend daily broad spectrum tinted sunscreen SPF 30+ to face, preferably with Zinc or Titanium Dioxide. Discussed Rx topical bleaching creams (i.e. hydroquinone), OTC HelioCare supplement, chemical peels (would need multiple for best result).   Chronic condition with duration over one year. No cure, only control. Condition is bothersome to patient. Currently flared.   Will need to wait 3 months before we can reconsider starting another version of hydroquinone cream   Start Inkey tranexamic acid at [facility] every night  [facility] prescription tretinoin 0.1% and  vitamin C cream at night (available here at front desk)   Recommend taking Heliocare sun protection supplement daily in sunny weather for additional sun protection. For maximum protection on the sunniest days, you can take up to 2 capsules of regular Heliocare OR take 1 capsule of Heliocare Ultra. For prolonged exposure (such as a full day in the sun), you can repeat your dose of the supplement 4 hours after your first dose. Heliocare can be purchased at [facility] or at [email or web address].   Recommend daily broad spectrum sunscreen SPF 30+ to sun-exposed areas, reapply every 2 hours as needed. Call for new or changing lesions.  Staying in the shade or wearing long sleeves, sun glasses (UVA+UVB protection) and wide brim hats (4-inch brim around the entire circumference of the hat) are also recommended for sun protection.  Recommend Alastin sunscreen for face.  Sebaceous hyperplasia of face left temple  Benign-appearing.  Observation.  Call clinic for new or changing lesions  Milia Right vermillion border, right temple  Symptomatic  Patient would like to have areas removed today   Acne/Milia surgery - Right vermillion border, right temple Procedure risks and benefits were discussed with the patient and verbal consent was obtained. Following prep of the skin on the mid forehead with an alcohol swab, extraction of milia was performed with a comedone extractor following superficial incision made over their surfaces with a #15 surgical blade. Capillary hemostasis was achieved with 20% aluminum chloride solution. Vaseline ointment was applied to each site. The patient tolerated the procedure well.  Return in about 3 months (around 01/19/2021) for melasma follow up.3 I, Ruthell Rummage, CMA, am acting as scribe for Forest Gleason, MD.   Documentation: I have reviewed the above documentation for accuracy and completeness, and I agree with the above.  Forest Gleason, MD

## 2020-10-20 NOTE — Patient Instructions (Addendum)
Melasma is a condition of persistent pigmented patches generally on the face, worse in summer due to higher UV exposure.  Oral estrogen containing BCPs or supplements can exacerbate condition.  Recommend daily broad spectrum tinted sunscreen SPF 30+ to face, preferably with Zinc or Titanium Dioxide. Discussed Rx topical bleaching creams (i.e. hydroquinone), OTC HelioCare supplement, chemical peels (would need multiple for best result).     Recommend taking Heliocare sun protection supplement daily in sunny weather for additional sun protection. For maximum protection on the sunniest days, you can take up to 2 capsules of regular Heliocare OR take 1 capsule of Heliocare Ultra. For prolonged exposure (such as a full day in the sun), you can repeat your dose of the supplement 4 hours after your first dose. Heliocare can be purchased at [facility] or at [email or web address].   Recommend daily broad spectrum sunscreen SPF 30+ to sun-exposed areas, reapply every 2 hours as needed. Call for new or changing lesions.  Staying in the shade or wearing long sleeves, sun glasses (UVA+UVB protection) and wide brim hats (4-inch brim around the entire circumference of the hat) are also recommended for sun protection.  Recommend Alastin sunscreen for face.  Topical retinoid medications like tretinoin/Retin-A, adapalene/Differin, tazarotene/Fabior, and Epiduo/Epiduo Forte can cause dryness and irritation when first started. Only apply a pea-sized amount to the entire affected area. Avoid applying it around the eyes, edges of mouth and creases at the nose. If you experience irritation, use a good moisturizer first and/or apply the medicine less often. If you are doing well with the medicine, you can increase how often you use it until you are applying every night. Be careful with sun protection while using this medication as it can make you sensitive to the sun. This medicine should not be used by pregnant women.    Inkey tranexamic acid at sephora every night  The Perfect A tretinoin and vitamin C cream at night [facility])   If you have any questions or concerns for your doctor, please call our main line at [phone] and press option 4 to reach your doctor's medical assistant. If no one answers, please leave a voicemail as directed and we will return your call as soon as possible. Messages left after 4 pm will be answered the following business day.   You may also send [place] a message via [place]. We typically respond to MyChart messages within 1-2 business days.  For prescription refills, please ask your pharmacy to contact our office. Our fax number is [phone].  If you have an urgent issue when the clinic is closed that cannot wait until the next business day, you can page your doctor at the number below.    Please note that while we do our best to be available for urgent issues outside of office hours, we are not available 24/7.   If you have an urgent issue and are unable to reach [place], you may choose to seek medical care at your doctor's office, retail clinic, urgent care center, or emergency room.  If you have a medical emergency, please immediately call 911 or go to the emergency department.  Pager Numbers  - Dr. Nehemiah Massed: [phone]  - Dr. Laurence Ferrari: [phone]  - Dr. Nicole Kindred: [phone]  In the event of inclement weather, please call our main line at [phone] for an update on the status of any delays or closures.  Dermatology Medication Tips: Please keep the boxes that topical medications come in in order to help  keep track of the instructions about where and how to use these. Pharmacies typically print the medication instructions only on the boxes and not directly on the medication tubes.   If your medication is too expensive, please contact our office at [phone] option 4 or send [place] a message through [place].   We are unable to tell what your co-pay for  medications will be in advance as this is different depending on your insurance coverage. However, we may be able to find a substitute medication at lower cost or fill out paperwork to get insurance to cover a needed medication.   If a prior authorization is required to get your medication covered by your insurance company, please allow [place] 1-2 business days to complete this process.  Drug prices often vary depending on where the prescription is filled and some pharmacies may offer cheaper prices.  The website www.goodrx.com contains coupons for medications through different pharmacies. The prices here do not account for what the cost may be with help from insurance (it may be cheaper with your insurance), but the website can give you the price if you did not use any insurance.  - You can print the associated coupon and take it with your prescription to the pharmacy.  - You may also stop by our office during regular business hours and pick up a GoodRx coupon card.  - If you need your prescription sent electronically to a different pharmacy, notify our office through [facility] or by phone at [phone] option 4.

## 2020-10-31 ENCOUNTER — Encounter: Payer: Self-pay | Admitting: Dermatology

## 2021-01-18 ENCOUNTER — Ambulatory Visit: Payer: 59 | Admitting: Dermatology

## 2021-07-05 ENCOUNTER — Encounter: Payer: 59 | Admitting: Primary Care

## 2021-07-18 ENCOUNTER — Other Ambulatory Visit: Payer: Self-pay

## 2021-07-18 ENCOUNTER — Encounter: Payer: Self-pay | Admitting: Dermatology

## 2021-07-18 ENCOUNTER — Ambulatory Visit (INDEPENDENT_AMBULATORY_CARE_PROVIDER_SITE_OTHER): Payer: 59 | Admitting: Dermatology

## 2021-07-18 DIAGNOSIS — L814 Other melanin hyperpigmentation: Secondary | ICD-10-CM

## 2021-07-18 DIAGNOSIS — L821 Other seborrheic keratosis: Secondary | ICD-10-CM | POA: Diagnosis not present

## 2021-07-18 DIAGNOSIS — L578 Other skin changes due to chronic exposure to nonionizing radiation: Secondary | ICD-10-CM

## 2021-07-18 DIAGNOSIS — Z1283 Encounter for screening for malignant neoplasm of skin: Secondary | ICD-10-CM | POA: Diagnosis not present

## 2021-07-18 DIAGNOSIS — D1800 Hemangioma unspecified site: Secondary | ICD-10-CM | POA: Diagnosis not present

## 2021-07-18 DIAGNOSIS — L811 Chloasma: Secondary | ICD-10-CM

## 2021-07-18 DIAGNOSIS — D229 Melanocytic nevi, unspecified: Secondary | ICD-10-CM | POA: Diagnosis not present

## 2021-07-18 NOTE — Progress Notes (Signed)
Follow-Up Visit   Subjective  [name] is a 36 y.o. female who presents for the following: FBSE (Patient here for full body skin exam and skin cancer screening. Patient with fhx of melanoma. Patient is not aware of any new or changing spots today.) and melasma (Patient restarted Skin Medicinals lightning cream 11/1, not currently using The Perfect A. Patient noticed improvement with first course of lightning cream early last year. ).   The following portions of the chart were reviewed this encounter and updated as appropriate:   Tobacco   Allergies   Meds   Problems   Med Hx   Surg Hx   Fam Hx       Review of Systems:  No other skin or systemic complaints except as noted in HPI or Assessment and Plan.  Objective  Well appearing patient in no apparent distress; mood and affect are within normal limits.  A full examination was performed including scalp, head, eyes, ears, nose, lips, neck, chest, axillae, abdomen, back, buttocks, bilateral upper extremities, bilateral lower extremities, hands, feet, fingers, toes, fingernails, and toenails. All findings within normal limits unless otherwise noted below.  face Faint reticulated hyperpigmented patches.     Assessment & Plan  Melasma face  Chronic condition with duration or expected duration over one year. Condition is bothersome to patient. Not currently at goal.  Discontinue Skin Medicinals 12% hydroquinone. Start skin medicinals prescription 8% hydroquinone with tretinoin 0.1% and fluocinolone once daily to affected areas x 1 month. Advised she must not use this longer than 1 more month due to risk of permanent dark spots (exogenous ochronosis).   Start Inkey tranexamic acid at sephora every night   The Perfect A prescription tretinoin 0.1% and vitamin C cream at night (available here at front desk)  Recommend taking Heliocare sun protection supplement daily in sunny weather for additional sun protection. For maximum  protection on the sunniest days, you can take up to 2 capsules of regular Heliocare OR take 1 capsule of Heliocare Ultra. For prolonged exposure (such as a full day in the sun), you can repeat your dose of the supplement 4 hours after your first dose. Heliocare can be purchased at [facility] or at [email or web address].   May restart hydroquinone in summer up to 2 months with 8%, up to 3 months with 12%. Patient will let us know when she is ready to restart so we can send in refills.    Lentigines - Scattered tan macules - Due to sun exposure - Benign-appearing, observe - Recommend daily broad spectrum sunscreen SPF 30+ to sun-exposed areas, reapply every 2 hours as needed. - Call for any changes  Seborrheic Keratoses - Stuck-on, waxy, tan-brown papules and/or plaques  - Benign-appearing - Discussed benign etiology and prognosis. - Observe - Call for any changes  Melanocytic Nevi - Tan-brown and/or pink-flesh-colored symmetric macules and papules - Benign appearing on exam today - Observation - Call clinic for new or changing moles - Recommend daily use of broad spectrum spf 30+ sunscreen to sun-exposed areas.   Hemangiomas - Red papules - Discussed benign nature - Observe - Call for any changes  Actinic Damage - Chronic condition, secondary to cumulative UV/sun exposure - diffuse scaly erythematous macules with underlying dyspigmentation - Recommend daily broad spectrum sunscreen SPF 30+ to sun-exposed areas, reapply every 2 hours as needed.  - Staying in the shade or wearing long sleeves, sun glasses (UVA+UVB protection) and wide brim hats (4-inch brim around the  entire circumference of the hat) are also recommended for sun protection.  - Call for new or changing lesions.  Skin cancer screening performed today.  Return in about 1 year (around 07/18/2022) for TBSE.  Graciella Belton, RMA, am acting as scribe for Forest Gleason, MD .  Documentation: I have reviewed the  above documentation for accuracy and completeness, and I agree with the above.  Forest Gleason, MD

## 2021-07-18 NOTE — Patient Instructions (Addendum)
Will prescribe Skin Medicinals Hydroquinone 8%/kojic acid/vitamin C cream pea sized amount once daily to the entire face for up to 1 month. This cannot be used more than 1 month due to risk of exogenous ochronosis (permanent dark spots). The patient was advised this is not covered by insurance since it is made by a compounding pharmacy. They will receive an email to check out and the medication will be mailed to their home.   May restart hydroquinone in summer up to 2 months with 8%, up to 3 months with 12%.   Instructions for Skin Medicinals Medications  One or more of your medications was sent to the [facility] mail order compounding pharmacy. You will receive an email from them and can purchase the medicine through that link. It will then be mailed to your home at the address you confirmed. If for any reason you do not receive an email from them, please check your spam folder. If you still do not find the email, please let us know. [facility] phone number is [phone].  Recommend taking Heliocare sun protection supplement daily in sunny weather for additional sun protection. For maximum protection on the sunniest days, you can take up to 2 capsules of regular Heliocare OR take 1 capsule of Heliocare Ultra. For prolonged exposure (such as a full day in the sun), you can repeat your dose of the supplement 4 hours after your first dose. Heliocare can be purchased at [facility] or at [email or web address].   Melanoma ABCDEs  Melanoma is the most dangerous type of skin cancer, and is the leading cause of death from skin disease.  You are more likely to develop melanoma if you: Have light-colored skin, light-colored eyes, or red or blond hair Spend a lot of time in the sun Tan regularly, either outdoors or in a tanning bed Have had blistering sunburns, especially during childhood Have a close family member who has had a melanoma Have atypical moles or large birthmarks  Early  detection of melanoma is key since treatment is typically straightforward and cure rates are extremely high if we catch it early.   The first sign of melanoma is often a change in a mole or a new dark spot.  The ABCDE system is a way of remembering the signs of melanoma.  A for asymmetry:  The two halves do not match. B for border:  The edges of the growth are irregular. C for color:  A mixture of colors are present instead of an even brown color. D for diameter:  Melanomas are usually (but not always) greater than 91mm - the size of a pencil eraser. E for evolution:  The spot keeps changing in size, shape, and color.  Please check your skin once per month between visits. You can use a small mirror in front and a large mirror behind you to keep an eye on the back side or your body.   If you see any new or changing lesions before your next follow-up, please call to schedule a visit.  Please continue daily skin protection including broad spectrum sunscreen SPF 30+ to sun-exposed areas, reapplying every 2 hours as needed when you're outdoors.    If You Need Anything After Your Visit  If you have any questions or concerns for your doctor, please call our main line at [phone] and press option 4 to reach your doctor's medical assistant. If no one answers, please leave a voicemail as directed and we will return your call  as soon as possible. Messages left after 4 pm will be answered the following business day.   You may also send [place] a message via [place]. We typically respond to MyChart messages within 1-2 business days.  For prescription refills, please ask your pharmacy to contact our office. Our fax number is [phone].  If you have an urgent issue when the clinic is closed that cannot wait until the next business day, you can page your doctor at the number below.    Please note that while we do our best to be available for urgent issues outside of office hours, we are not available  24/7.   If you have an urgent issue and are unable to reach [place], you may choose to seek medical care at your doctor's office, retail clinic, urgent care center, or emergency room.  If you have a medical emergency, please immediately call 911 or go to the emergency department.  Pager Numbers  - Dr. Nehemiah Massed: [phone]  - Dr. Laurence Ferrari: [phone]  - Dr. Nicole Kindred: [phone]  In the event of inclement weather, please call our main line at [phone] for an update on the status of any delays or closures.  Dermatology Medication Tips: Please keep the boxes that topical medications come in in order to help keep track of the instructions about where and how to use these. Pharmacies typically print the medication instructions only on the boxes and not directly on the medication tubes.   If your medication is too expensive, please contact our office at [phone] option 4 or send [place] a message through [place].   We are unable to tell what your co-pay for medications will be in advance as this is different depending on your insurance coverage. However, we may be able to find a substitute medication at lower cost or fill out paperwork to get insurance to cover a needed medication.   If a prior authorization is required to get your medication covered by your insurance company, please allow [place] 1-2 business days to complete this process.  Drug prices often vary depending on where the prescription is filled and some pharmacies may offer cheaper prices.  The website www.goodrx.com contains coupons for medications through different pharmacies. The prices here do not account for what the cost may be with help from insurance (it may be cheaper with your insurance), but the website can give you the price if you did not use any insurance.  - You can print the associated coupon and take it with your prescription to the pharmacy.  - You may also stop by our office during regular business hours and pick up  a GoodRx coupon card.  - If you need your prescription sent electronically to a different pharmacy, notify our office through [facility] or by phone at [phone] option 4.     Si Usted Necesita Algo Despus de Su Visita  Tambin puede enviarnos un mensaje a travs de Pharmacist, community. Por lo general respondemos a los mensajes de MyChart en el transcurso de 1 a 2 das hbiles.  Para renovar recetas, por favor pida a su farmacia que se ponga en contacto con nuestra oficina. Harland Dingwall de fax es [place] [phone].  Si tiene un asunto urgente cuando la clnica est cerrada y que no puede esperar hasta el siguiente da hbil, puede llamar/localizar a su doctor(a) al nmero que aparece a continuacin.   Por favor, tenga en cuenta que aunque hacemos todo lo posible para estar disponibles para asuntos urgentes fuera del  horario de oficina, no estamos disponibles las 24 horas del da, los 7 das de la [place].   Si tiene un problema urgente y no puede comunicarse con nosotros, puede optar por buscar atencin mdica  en el consultorio de su doctor(a), en una clnica privada, en un centro de atencin urgente o en una sala de emergencias.  Si tiene Engineering geologist, por favor llame inmediatamente al 911 o vaya a la sala de emergencias.  Nmeros de bper  - Dr. Nehemiah Massed: [phone]  - Dra. Moye: [phone]  - Dra. Nicole Kindred: [phone]  En caso de inclemencias del [place], por favor llame a Johnsie Kindred principal al [phone] para una actualizacin sobre el [place] de cualquier retraso o cierre.  Consejos para la medicacin en dermatologa: Por favor, guarde las cajas en las que vienen los medicamentos de uso tpico para ayudarle a seguir las instrucciones sobre dnde y cmo usarlos. Las farmacias generalmente imprimen las instrucciones del medicamento slo en las cajas y no directamente en los tubos del [place].   Si su medicamento es muy caro, por favor, pngase en contacto  con Zigmund Daniel llamando al [phone] y presione la opcin 4 o envenos un mensaje a travs de Pharmacist, community.   No podemos decirle cul ser su copago por los medicamentos por adelantado ya que esto es diferente dependiendo de la cobertura de su seguro. Sin embargo, es posible que podamos encontrar un medicamento sustituto a Electrical engineer un formulario para que el seguro cubra el medicamento que se considera necesario.   Si se requiere una autorizacin previa para que su compaa de seguros [place] su medicamento, por favor permtanos de 1 a 2 das hbiles para completar este proceso.  Los precios de los medicamentos varan con frecuencia dependiendo del Environmental consultant de dnde se surte la receta y alguna farmacias pueden ofrecer precios ms baratos.  El sitio web www.goodrx.com tiene cupones para medicamentos de Airline pilot. Los precios aqu no tienen en cuenta lo que podra costar con la ayuda del seguro (puede ser ms barato con su seguro), pero el sitio web puede darle el precio si no utiliz Research scientist (physical sciences).  - Puede imprimir el cupn correspondiente y llevarlo con su receta a la farmacia.  - Tambin puede pasar por nuestra oficina durante el horario de atencin regular y Charity fundraiser una tarjeta de cupones de GoodRx.  - Si necesita que su receta se enve electrnicamente a una farmacia diferente, informe a nuestra oficina a travs de MyChart de [place] o por telfono llamando al [phone] y presione la opcin 4.

## 2021-07-20 ENCOUNTER — Encounter: Payer: 59 | Admitting: Dermatology

## 2021-07-21 ENCOUNTER — Other Ambulatory Visit: Payer: Self-pay

## 2021-08-21 ENCOUNTER — Other Ambulatory Visit: Payer: Self-pay

## 2021-08-22 ENCOUNTER — Other Ambulatory Visit: Payer: Self-pay

## 2021-08-23 ENCOUNTER — Other Ambulatory Visit: Payer: Self-pay

## 2021-08-29 ENCOUNTER — Encounter: Payer: 59 | Admitting: Primary Care

## 2021-09-06 ENCOUNTER — Other Ambulatory Visit: Payer: Self-pay

## 2021-09-06 ENCOUNTER — Telehealth: Payer: 59 | Admitting: Physician Assistant

## 2021-09-06 DIAGNOSIS — B9689 Other specified bacterial agents as the cause of diseases classified elsewhere: Secondary | ICD-10-CM

## 2021-09-06 DIAGNOSIS — J019 Acute sinusitis, unspecified: Secondary | ICD-10-CM | POA: Diagnosis not present

## 2021-09-06 MED ORDER — DOXYCYCLINE HYCLATE 100 MG PO TABS
100.0000 mg | ORAL_TABLET | Freq: Two times a day (BID) | ORAL | 0 refills | Status: DC
  Filled 2021-09-06: qty 20, 10d supply, fill #0

## 2021-09-06 NOTE — Progress Notes (Signed)
I have spent 5 minutes in review of e-visit questionnaire, review and updating patient chart, medical decision making and response to patient.   Tywon Niday Cody Estelle Skibicki, PA-C    

## 2021-09-06 NOTE — Progress Notes (Signed)

## 2021-10-12 DIAGNOSIS — H52223 Regular astigmatism, bilateral: Secondary | ICD-10-CM | POA: Diagnosis not present

## 2021-10-17 ENCOUNTER — Ambulatory Visit (INDEPENDENT_AMBULATORY_CARE_PROVIDER_SITE_OTHER): Payer: 59 | Admitting: Primary Care

## 2021-10-17 ENCOUNTER — Encounter: Payer: Self-pay | Admitting: Primary Care

## 2021-10-17 VITALS — BP 122/74 | HR 74 | Ht 63.5 in | Wt 158.4 lb

## 2021-10-17 DIAGNOSIS — Z0000 Encounter for general adult medical examination without abnormal findings: Secondary | ICD-10-CM | POA: Diagnosis not present

## 2021-10-17 NOTE — Assessment & Plan Note (Signed)
Tetanus due, she declines today but will set up a nurse visit next month.  ?Pap smear UTD. ? ?Commended her on healthy lifestyle changes. ?Encouraged to continue! ? ?Exam today unremarkable. ?Patient declines labs today. ?

## 2021-10-17 NOTE — Progress Notes (Signed)
? ?Subjective:  ? ? Patient ID: [name], female    DOB: Aug 06, 1985, 36 y.o.   MRN: [id number] ? ?HPI ? ?[name] is a very pleasant 36 y.o. female who presents today for complete physical and follow up of chronic conditions. ? ?Immunizations: ?-Tetanus: 2013 ?-Influenza: Completed this season  ?-Covid-19: Completed three vaccines  ? ?Diet: Healthy diet. Starting in November she began walking three-5 days weekly. She's frustrated regarding lack of weight loss. She's undergone hormone testing per her GYN last year, all labs normal. She lost 8 pounds in Spring 2022 after following a strict diet. She is drinking 3 liters of water daily. She has tried intermittent fasting, keto diet without improvement. She has lost inches, has gone down in her pant size. ?Exercise: Walking 3-5 days weekly.  ? ?Eye exam: Completes annually  ?Dental exam: Completes semi-annually  ? ?Pap Smear: Completes per GYN.  ? ?BP Readings from Last 3 Encounters:  ?10/17/21 122/74  ?07/04/20 100/84  ?07/03/19 100/68  ? ? ?Wt Readings from Last 3 Encounters:  ?10/17/21 158 lb 6.4 oz (71.8 kg)  ?07/04/20 153 lb (69.4 kg)  ?07/03/19 141 lb 4 oz (64.1 kg)  ? ? ? ? ? ? ?Review of Systems  ?Constitutional:  Negative for unexpected weight change.  ?HENT:  Negative for rhinorrhea.   ?Respiratory:  Negative for cough and shortness of breath.   ?Cardiovascular:  Negative for chest pain.  ?Gastrointestinal:  Negative for constipation and diarrhea.  ?Genitourinary:  Negative for difficulty urinating and menstrual problem.  ?Musculoskeletal:  Negative for arthralgias and myalgias.  ?Skin:  Negative for rash.  ?Allergic/Immunologic: Negative for environmental allergies.  ?Neurological:  Negative for dizziness and headaches.  ?Psychiatric/Behavioral:  The patient is not nervous/anxious.   ? ?   ? ? ?Past Medical History:  ?Diagnosis Date  ? Acute heart failure ([place]) 2009  ? With pregnancy  ? Chicken pox   ? History of UTI   ? Maternal anemia, with  delivery 11/27/2011  ? PP care - s/p rC/S 5/13 11/26/2011  ? Pre-eclampsia 2009  ? first pregnancy  ? Thrombocytopenia ([place]) 11/27/2011  ? ? ?Social History  ? ?Socioeconomic History  ? Marital status: Married  ?  Spouse name: Not on file  ? Number of children: Not on file  ? Years of education: Not on file  ? Highest education level: Not on file  ?Occupational History  ? Not on file  ?Tobacco Use  ? Smoking status: Never  ? Smokeless tobacco: Never  ?Vaping Use  ? Vaping Use: Never used  ?Substance and Sexual Activity  ? Alcohol use: Yes  ?  Comment: socially  ? Drug use: No  ? Sexual activity: Yes  ?Other Topics Concern  ? Not on file  ?Social History Narrative  ? Married.  ? 2 children.  ? Works as a Marine scientist at [employer].   ? Enjoys spending time with her family, decorate.  ? ?Social Determinants of Health  ? ?Financial Resource Strain: Not on file  ?Food Insecurity: Not on file  ?Transportation Needs: Not on file  ?Physical Activity: Not on file  ?Stress: Not on file  ?Social Connections: Not on file  ?Intimate Partner Violence: Not on file  ? ? ?Past Surgical History:  ?Procedure Laterality Date  ? CESAREAN SECTION    ? CESAREAN SECTION  11/26/2011  ? Procedure: CESAREAN SECTION;  Surgeon: Marvene Staff, MD;  Location: [facility];  Service: Gynecology;  Laterality: N/A;  EDD: 11/25/11  ? ? ?Family History  ?Problem Relation Age of Onset  ? Melanoma Mother   ? Melanoma Father   ? Prostate cancer Father   ? Hyperlipidemia Father   ? Hypertension Father   ? Breast cancer Paternal Grandmother   ? Diabetes Paternal Grandmother   ? Diabetes Paternal Grandfather   ? ? ?Allergies  ?Allergen Reactions  ? Penicillins Hives  ?  Childhood reaction  ? ? ?Current Outpatient Medications on File Prior to Visit  ?Medication Sig Dispense Refill  ? acetaminophen (TYLENOL) 325 MG tablet Take 650 mg by mouth daily as needed. headache    ? cetirizine (ZYRTEC) 10 MG tablet Take 10 mg by mouth daily.    ? ibuprofen (ADVIL,MOTRIN) 200 MG  tablet Take 200 mg by mouth every 6 (six) hours as needed.    ? levonorgestrel (MIRENA) 20 MCG/24HR IUD 1 each by Intrauterine route once.    ? ?No current facility-administered medications on file prior to visit.  ? ? ?BP 122/74   Pulse 74   Ht 5' 3.5" (1.613 m)   Wt 158 lb 6.4 oz (71.8 kg)   LMP 10/13/2021   SpO2 97%   BMI 27.62 kg/m?  ?Objective:  ? Physical Exam ?HENT:  ?   Right Ear: Tympanic membrane and ear canal normal.  ?   Left Ear: Tympanic membrane and ear canal normal.  ?   Nose: Nose normal.  ?Eyes:  ?   Conjunctiva/sclera: Conjunctivae normal.  ?   Pupils: Pupils are equal, round, and reactive to light.  ?Neck:  ?   Thyroid: No thyromegaly.  ?Cardiovascular:  ?   Rate and Rhythm: Normal rate and regular rhythm.  ?   Heart sounds: No murmur heard. ?Pulmonary:  ?   Effort: Pulmonary effort is normal.  ?   Breath sounds: Normal breath sounds. No rales.  ?Abdominal:  ?   General: Bowel sounds are normal.  ?   Palpations: Abdomen is soft.  ?   Tenderness: There is no abdominal tenderness.  ?Musculoskeletal:     ?   General: Normal range of motion.  ?   Cervical back: Neck supple.  ?Lymphadenopathy:  ?   Cervical: No cervical adenopathy.  ?Skin: ?   General: Skin is warm and dry.  ?   Findings: No rash.  ?Neurological:  ?   Mental Status: She is alert and oriented to person, place, and time.  ?   Cranial Nerves: No cranial nerve deficit.  ?   Deep Tendon Reflexes: Reflexes are normal and symmetric.  ?Psychiatric:     ?   Mood and Affect: Mood normal.  ? ? ? ? ? ?   ?Assessment & Plan:  ? ? ? ? ?This visit occurred during the SARS-CoV-2 public health emergency.  Safety protocols were in place, including screening questions prior to the visit, additional usage of staff PPE, and extensive cleaning of exam room while observing appropriate contact time as indicated for disinfecting solutions.  ?

## 2021-10-17 NOTE — Patient Instructions (Signed)
Schedule nurse visit for next month to complete your tetanus vaccine. ? ?It was a pleasure to see you today! ? ?Preventive Care 57-38 Years Old, Female ?Preventive care refers to lifestyle choices and visits with your health care provider that can promote health and wellness. Preventive care visits are also called wellness exams. ?What can I expect for my preventive care visit? ?Counseling ?During your preventive care visit, your health care provider may ask about your: ?Medical history, including: ?Past medical problems. ?Family medical history. ?Pregnancy history. ?Current health, including: ?Menstrual cycle. ?Method of birth control. ?Emotional well-being. ?Home life and relationship well-being. ?Sexual activity and sexual health. ?Lifestyle, including: ?Alcohol, nicotine or tobacco, and drug use. ?Access to firearms. ?Diet, exercise, and sleep habits. ?Work and work Statistician. ?Sunscreen use. ?Safety issues such as seatbelt and bike helmet use. ?Physical exam ?Your health care provider may check your: ?Height and weight. These may be used to calculate your BMI (body mass index). BMI is a measurement that tells if you are at a healthy weight. ?Waist circumference. This measures the distance around your waistline. This measurement also tells if you are at a healthy weight and may help predict your risk of certain diseases, such as type 2 diabetes and high blood pressure. ?Heart rate and blood pressure. ?Body temperature. ?Skin for abnormal spots. ?What immunizations do I need? ?Vaccines are usually given at various ages, according to a schedule. Your health care provider will recommend vaccines for you based on your age, medical history, and lifestyle or other factors, such as travel or where you work. ?What tests do I need? ?Screening ?Your health care provider may recommend screening tests for certain conditions. This may include: ?Pelvic exam and Pap test. ?Lipid and cholesterol levels. ?Diabetes screening.  This is done by checking your blood sugar (glucose) after you have not eaten for a while (fasting). ?Hepatitis B test. ?Hepatitis C test. ?HIV (human immunodeficiency virus) test. ?STI (sexually transmitted infection) testing, if you are at risk. ?BRCA-related cancer screening. This may be done if you have a family history of breast, ovarian, tubal, or peritoneal cancers. ?Talk with your health care provider about your test results, treatment options, and if necessary, the need for more tests. ?Follow these instructions at home: ?Eating and drinking ? ?Eat a healthy diet that includes fresh fruits and vegetables, whole grains, lean protein, and low-fat dairy products. ?Take vitamin and mineral supplements as recommended by your health care provider. ?Do not drink alcohol if: ?Your health care provider tells you not to drink. ?You are pregnant, may be pregnant, or are planning to become pregnant. ?If you drink alcohol: ?Limit how much you have to 0-1 drink a day. ?Know how much alcohol is in your drink. In the [place] one drink equals one 12 oz bottle of beer (355 mL), one 5 oz glass of wine (148 mL), or one 1? oz glass of hard liquor (44 mL). ?Lifestyle ?Brush your teeth every morning and night with fluoride toothpaste. Floss one time each day. ?Exercise for at least 30 minutes 5 or more days each week. ?Do not use any products that contain nicotine or tobacco. These products include cigarettes, chewing tobacco, and vaping devices, such as e-cigarettes. If you need help quitting, ask your health care provider. ?Do not use drugs. ?If you are sexually active, practice safe sex. Use a condom or other form of protection to prevent STIs. ?If you do not wish to become pregnant, use a form of birth control. If you plan to  become pregnant, see your health care provider for a prepregnancy visit. ?Find healthy ways to manage stress, such as: ?Meditation, yoga, or listening to music. ?Journaling. ?Talking to a trusted  person. ?Spending time with friends and family. ?Minimize exposure to UV radiation to reduce your risk of skin cancer. ?Safety ?Always wear your seat belt while driving or riding in a vehicle. ?Do not drive: ?If you have been drinking alcohol. Do not ride with someone who has been drinking. ?If you have been using any mind-altering substances or drugs. ?While texting. ?When you are tired or distracted. ?Wear a helmet and other protective equipment during sports activities. ?If you have firearms in your house, make sure you follow all gun safety procedures. ?Seek help if you have been physically or sexually abused. ?What's next? ?Go to your health care provider once a year for an annual wellness visit. ?Ask your health care provider how often you should have your eyes and teeth checked. ?Stay up to date on all vaccines. ?This information is not intended to replace advice given to you by your health care provider. Make sure you discuss any questions you have with your health care provider. ?Document Revised: 12/28/2020 Document Reviewed: 12/28/2020 ?Elsevier Patient Education ? [place]. ? ?

## 2021-11-28 DIAGNOSIS — Z01419 Encounter for gynecological examination (general) (routine) without abnormal findings: Secondary | ICD-10-CM | POA: Diagnosis not present

## 2021-11-28 DIAGNOSIS — Z30431 Encounter for routine checking of intrauterine contraceptive device: Secondary | ICD-10-CM | POA: Diagnosis not present

## 2021-12-01 ENCOUNTER — Other Ambulatory Visit: Payer: Self-pay

## 2021-12-01 ENCOUNTER — Telehealth: Payer: 59 | Admitting: Emergency Medicine

## 2021-12-01 DIAGNOSIS — N3000 Acute cystitis without hematuria: Secondary | ICD-10-CM

## 2021-12-01 MED ORDER — NITROFURANTOIN MONOHYD MACRO 100 MG PO CAPS
100.0000 mg | ORAL_CAPSULE | Freq: Two times a day (BID) | ORAL | 0 refills | Status: DC
  Filled 2021-12-01: qty 10, 5d supply, fill #0

## 2021-12-01 NOTE — Progress Notes (Signed)
E-Visit for Urinary Problems ? ?We are sorry that you are not feeling well.  Here is how we plan to help! ? ?Based on what you shared with me it looks like you most likely have a simple urinary tract infection. ? ?A UTI (Urinary Tract Infection) is a bacterial infection of the bladder. ? ?Most cases of urinary tract infections are simple to treat but a key part of your care is to encourage you to drink plenty of fluids and watch your symptoms carefully. ? ?I have prescribed MacroBid 100 mg twice a day for 5 days.  Your symptoms should gradually improve. Call us if the burning in your urine worsens, you develop worsening fever, back pain or pelvic pain or if your symptoms do not resolve after completing the antibiotic. ? ?Urinary tract infections can be prevented by drinking plenty of water to keep your body hydrated.  Also be sure when you wipe, wipe from front to back and don't hold it in!  If possible, empty your bladder every 4 hours. ? ?HOME CARE ?Drink plenty of fluids ?Compete the full course of the antibiotics even if the symptoms resolve ?Remember, when you need to go?go. Holding in your urine can increase the likelihood of getting a UTI! ?GET HELP RIGHT AWAY IF: ?You cannot urinate ?You get a high fever ?Worsening back pain occurs ?You see blood in your urine ?You feel sick to your stomach or throw up ?You feel like you are going to pass out ? ?MAKE SURE YOU  ?Understand these instructions. ?Will watch your condition. ?Will get help right away if you are not doing well or get worse. ? ? ?Thank you for choosing an e-visit. ? ?Your e-visit answers were reviewed by a board certified advanced clinical practitioner to complete your personal care plan. Depending upon the condition, your plan could have included both over the counter or prescription medications. ? ?Please review your pharmacy choice. Make sure the pharmacy is open so you can pick up prescription now. If there is a problem, you may contact your  provider through MyChart messaging and have the prescription routed to another pharmacy.  Your safety is important to us. If you have drug allergies check your prescription carefully.  ? ?For the next 24 hours you can use MyChart to ask questions about today's visit, request a non-urgent call back, or ask for a work or school excuse. ?You will get an email in the next two days asking about your experience. I hope that your e-visit has been valuable and will speed your recovery. ? ?I have spent 5 minutes in review of e-visit questionnaire, review and updating patient chart, medical decision making and response to patient.  ? ?Syon Tews, PhD, FNP-BC ?  ?

## 2022-06-10 ENCOUNTER — Telehealth: Payer: 59 | Admitting: Nurse Practitioner

## 2022-06-10 DIAGNOSIS — R399 Unspecified symptoms and signs involving the genitourinary system: Secondary | ICD-10-CM

## 2022-06-10 MED ORDER — NITROFURANTOIN MONOHYD MACRO 100 MG PO CAPS: 100.0000 mg | ORAL_CAPSULE | Freq: Two times a day (BID) | ORAL | 0 refills | Status: DC

## 2022-06-10 NOTE — Progress Notes (Signed)

## 2022-06-10 NOTE — Progress Notes (Signed)
I have spent 5 minutes in review of e-visit questionnaire, review and updating patient chart, medical decision making and response to patient.  ° °[name] W Reylynn Vanalstine, NP ° °  °

## 2022-07-06 ENCOUNTER — Ambulatory Visit: Payer: 59 | Admitting: Nurse Practitioner

## 2022-07-06 ENCOUNTER — Encounter: Payer: Self-pay | Admitting: Nurse Practitioner

## 2022-07-06 ENCOUNTER — Other Ambulatory Visit: Payer: Self-pay

## 2022-07-06 VITALS — BP 114/90 | HR 101 | Temp 99.5°F | Resp 16 | Ht 63.5 in | Wt 150.4 lb

## 2022-07-06 DIAGNOSIS — G4489 Other headache syndrome: Secondary | ICD-10-CM | POA: Insufficient documentation

## 2022-07-06 DIAGNOSIS — J020 Streptococcal pharyngitis: Secondary | ICD-10-CM | POA: Diagnosis not present

## 2022-07-06 DIAGNOSIS — R051 Acute cough: Secondary | ICD-10-CM | POA: Insufficient documentation

## 2022-07-06 DIAGNOSIS — R52 Pain, unspecified: Secondary | ICD-10-CM | POA: Diagnosis not present

## 2022-07-06 DIAGNOSIS — J029 Acute pharyngitis, unspecified: Secondary | ICD-10-CM | POA: Insufficient documentation

## 2022-07-06 HISTORY — DX: Streptococcal pharyngitis: J02.0

## 2022-07-06 HISTORY — DX: Other headache syndrome: G44.89

## 2022-07-06 LAB — POCT RAPID STREP A (OFFICE): Rapid Strep A Screen: POSITIVE — AB

## 2022-07-06 MED ORDER — AZITHROMYCIN 250 MG PO TABS
ORAL_TABLET | ORAL | 0 refills | Status: AC
  Filled 2022-07-06: qty 6, 5d supply, fill #0

## 2022-07-06 MED ORDER — HYDROCODONE BIT-HOMATROP MBR 5-1.5 MG/5ML PO SOLN
5.0000 mL | Freq: Two times a day (BID) | ORAL | 0 refills | Status: AC | PRN
  Filled 2022-07-06: qty 80, 8d supply, fill #0

## 2022-07-06 NOTE — Assessment & Plan Note (Signed)
Positive strep test in office pending flu, COVID, RSV.

## 2022-07-06 NOTE — Assessment & Plan Note (Signed)
Rest over-the-counter analgesics as needed pending flu COVID and flu test.

## 2022-07-06 NOTE — Progress Notes (Signed)
Acute Office Visit  Subjective:     Patient ID: [name], female    DOB: 06/26/1986, 36 y.o.   MRN: [id number]  Chief Complaint  Patient presents with   Cough    Since wednesday   Sore Throat    Can taste blood  in mouth took covid test yesterday neg     Patient is in today for Sick symptoms   States that Wednesday she started to have a weird cough. States that yesterday her chest felt raw. As the day progressed her symptoms got worse. States that she can taste blood in her mouth. 95.9 oral temp yesterday.  Covid test  yesterday that was negative States that she take zyrtec, cold and flu cough medication that has helped some.  No sick contacts  Review of Systems  Constitutional:  Positive for chills and malaise/fatigue. Negative for fever.  HENT:  Positive for congestion, ear pain, sinus pain and sore throat. Negative for ear discharge.   Respiratory:  Positive for cough, sputum production and shortness of breath (DOE).   Musculoskeletal:  Negative for joint pain and myalgias.  Neurological:  Positive for headaches.        Objective:    BP (!) 114/90   Pulse (!) 101   Temp 99.5 F (37.5 C)   Resp 16   Ht 5' 3.5" (1.613 m)   Wt 150 lb 6 oz (68.2 kg)   SpO2 97%   Breastfeeding No   BMI 26.22 kg/m    Physical Exam Vitals and nursing note reviewed.  Constitutional:      Appearance: Normal appearance.  HENT:     Right Ear: Tympanic membrane, ear canal and external ear normal.     Left Ear: Tympanic membrane, ear canal and external ear normal.     Mouth/Throat:     Mouth: Mucous membranes are moist.     Pharynx: Posterior oropharyngeal erythema present.  Cardiovascular:     Rate and Rhythm: Normal rate and regular rhythm.     Heart sounds: Normal heart sounds.  Pulmonary:     Effort: Pulmonary effort is normal.     Breath sounds: Normal breath sounds.  Lymphadenopathy:     Cervical: Cervical adenopathy present.  Neurological:     Mental Status:  She is alert.     Results for orders placed or performed in visit on 07/06/22  POCT rapid strep A  Result Value Ref Range   Rapid Strep A Screen Positive (A) Negative        Assessment & Plan:   Problem List Items Addressed This Visit       Respiratory   Strep pharyngitis    Treat with azithromycin pack.  Warm salt water gargles rest push fluids over-the-counter analgesics as needed.      Relevant Medications   azithromycin (ZITHROMAX) 250 MG tablet     Other   Other headache syndrome    Rest drink plenty of fluid over-the-counter analgesics as needed.  Pending flu COVID RSV test      Relevant Orders   COVID-19, Flu A+B and RSV   Body aches    Rest over-the-counter analgesics as needed pending flu COVID and flu test.      Relevant Orders   COVID-19, Flu A+B and RSV   Acute cough    Hycodan 5 mg twice daily as needed.  Sedation precautions reviewed      Relevant Medications   HYDROcodone bit-homatropine (HYCODAN) 5-1.5 MG/5ML syrup  Other Relevant Orders   COVID-19, Flu A+B and RSV   Sore throat - Primary    Positive strep test in office pending flu, COVID, RSV.      Relevant Orders   POCT rapid strep A (Completed)   COVID-19, Flu A+B and RSV    Meds ordered this encounter  Medications   azithromycin (ZITHROMAX) 250 MG tablet    Sig: Take 2 tablets on day 1, then 1 tablet daily on days 2 through 5    Dispense:  6 tablet    Refill:  0    Order Specific Question:   Supervising Provider    Answer:   Glori Bickers, MARNE A [[id number]]   HYDROcodone bit-homatropine (HYCODAN) 5-1.5 MG/5ML syrup    Sig: Take 5 mLs by mouth 2 (two) times daily as needed for up to 8 days for cough.    Dispense:  80 mL    Refill:  0    Order Specific Question:   Supervising Provider    Answer:   TOWER, MARNE A [[id number]]    No follow-ups on file.  Romilda Garret, NP

## 2022-07-06 NOTE — Assessment & Plan Note (Signed)
Treat with azithromycin pack.  Warm salt water gargles rest push fluids over-the-counter analgesics as needed.

## 2022-07-06 NOTE — Patient Instructions (Signed)
Nice to see you today I have sent in cough medication that can make you sleepy I have sent in the z pak to treat for strep Follow up if no improvement

## 2022-07-06 NOTE — Assessment & Plan Note (Signed)
Rest drink plenty of fluid over-the-counter analgesics as needed.  Pending flu COVID RSV test

## 2022-07-06 NOTE — Assessment & Plan Note (Signed)
Hycodan 5 mg twice daily as needed.  Sedation precautions reviewed

## 2022-07-07 LAB — COVID-19, FLU A+B AND RSV
Influenza A, NAA: DETECTED — AB
Influenza B, NAA: NOT DETECTED
RSV, NAA: NOT DETECTED
SARS-CoV-2, NAA: NOT DETECTED

## 2022-07-19 ENCOUNTER — Encounter: Payer: 59 | Admitting: Dermatology

## 2022-10-23 ENCOUNTER — Encounter: Payer: Self-pay | Admitting: Primary Care

## 2022-10-25 ENCOUNTER — Ambulatory Visit (INDEPENDENT_AMBULATORY_CARE_PROVIDER_SITE_OTHER): Payer: 59 | Admitting: Primary Care

## 2022-10-25 ENCOUNTER — Encounter: Payer: Self-pay | Admitting: Primary Care

## 2022-10-25 VITALS — BP 112/68 | HR 72 | Temp 98.1°F | Ht 63.5 in | Wt 153.0 lb

## 2022-10-25 DIAGNOSIS — Z1589 Genetic susceptibility to other disease: Secondary | ICD-10-CM | POA: Diagnosis not present

## 2022-10-25 DIAGNOSIS — Z23 Encounter for immunization: Secondary | ICD-10-CM

## 2022-10-25 DIAGNOSIS — Z0000 Encounter for general adult medical examination without abnormal findings: Secondary | ICD-10-CM

## 2022-10-25 DIAGNOSIS — R5382 Chronic fatigue, unspecified: Secondary | ICD-10-CM | POA: Diagnosis not present

## 2022-10-25 NOTE — Progress Notes (Signed)
Subjective:    Patient ID: [name], female    DOB: Jan 18, 1986, 37 y.o.   MRN: [id number]  HPI  [name] is a very pleasant 37 y.o. female who presents today for complete physical and follow up of chronic conditions.  Immunizations: -Tetanus: Completed in 2013, due -Influenza: Completed this season  Diet: Fair diet.  Exercise: Active at work.   Eye exam: Completes annually  Dental exam: Completes semi-annually    Pap Smear: 2020, follows with GYN. Due next month and is scheduled.   BP Readings from Last 3 Encounters:  10/25/22 112/68  07/06/22 (!) 114/90  10/17/21 122/74       Review of Systems  Constitutional:  Positive for fatigue. Negative for unexpected weight change.  HENT:  Negative for rhinorrhea.   Respiratory:  Negative for cough and shortness of breath.   Cardiovascular:  Negative for chest pain.  Gastrointestinal:  Negative for constipation and diarrhea.  Genitourinary:  Negative for difficulty urinating.  Musculoskeletal:  Negative for arthralgias and myalgias.  Skin:  Negative for rash.  Allergic/Immunologic: Negative for environmental allergies.  Neurological:  Negative for dizziness and headaches.  Psychiatric/Behavioral:  The patient is not nervous/anxious.          Past Medical History:  Diagnosis Date   Acute heart failure 2009   With pregnancy   Chicken pox    History of UTI    Maternal anemia, with delivery 11/27/2011   PP care - s/p rC/S 5/13 11/26/2011   Pre-eclampsia 2009   first pregnancy   Right upper quadrant abdominal pain 04/19/2016   Strep pharyngitis 07/06/2022   Thrombocytopenia 11/27/2011    Social History   Socioeconomic History   Marital status: Married    Spouse name: Not on file   Number of children: Not on file   Years of education: Not on file   Highest education level: Not on file  Occupational History   Not on file  Tobacco Use   Smoking status: Never   Smokeless tobacco: Never  Vaping Use    Vaping Use: Never used  Substance and Sexual Activity   Alcohol use: Yes    Comment: socially   Drug use: No   Sexual activity: Yes  Other Topics Concern   Not on file  Social History Narrative   Married.   2 children.   Works as a Engineer, civil (consulting) at [employer].    Enjoys spending time with her family, decorate.   Social Determinants of Health   Financial Resource Strain: Not on file  Food Insecurity: Not on file  Transportation Needs: Not on file  Physical Activity: Not on file  Stress: Not on file  Social Connections: Not on file  Intimate Partner Violence: Not on file    Past Surgical History:  Procedure Laterality Date   CESAREAN SECTION     CESAREAN SECTION  11/26/2011   Procedure: CESAREAN SECTION;  Surgeon: Serita Kyle, MD;  Location: [facility];  Service: Gynecology;  Laterality: N/A;  EDD: 11/25/11    Family History  Problem Relation Age of Onset   Melanoma Mother    Melanoma Father    Prostate cancer Father    Hyperlipidemia Father    Hypertension Father    Breast cancer Paternal Grandmother    Diabetes Paternal Grandmother    Diabetes Paternal Grandfather     Allergies  Allergen Reactions   Penicillins Hives    Childhood reaction    Current Outpatient Medications on  File Prior to Visit  Medication Sig Dispense Refill   acetaminophen (TYLENOL) 325 MG tablet Take 650 mg by mouth daily as needed. headache     cetirizine (ZYRTEC) 10 MG tablet Take 10 mg by mouth daily.     ibuprofen (ADVIL,MOTRIN) 200 MG tablet Take 200 mg by mouth every 6 (six) hours as needed.     levonorgestrel (MIRENA) 20 MCG/24HR IUD 1 each by Intrauterine route once.     No current facility-administered medications on file prior to visit.    BP 112/68   Pulse 72   Temp 98.1 F (36.7 C) (Temporal)   Ht 5' 3.5" (1.613 m)   Wt 153 lb (69.4 kg)   SpO2 98%   BMI 26.68 kg/m  Objective:   Physical Exam HENT:     Right Ear: Tympanic membrane and ear canal normal.     Left Ear:  Tympanic membrane and ear canal normal.     Nose: Nose normal.  Eyes:     Conjunctiva/sclera: Conjunctivae normal.     Pupils: Pupils are equal, round, and reactive to light.  Neck:     Thyroid: No thyromegaly.  Cardiovascular:     Rate and Rhythm: Normal rate and regular rhythm.     Heart sounds: No murmur heard. Pulmonary:     Effort: Pulmonary effort is normal.     Breath sounds: Normal breath sounds. No rales.  Abdominal:     General: Bowel sounds are normal.     Palpations: Abdomen is soft.     Tenderness: There is no abdominal tenderness.  Musculoskeletal:        General: Normal range of motion.     Cervical back: Neck supple.  Lymphadenopathy:     Cervical: No cervical adenopathy.  Skin:    General: Skin is warm and dry.     Findings: No rash.  Neurological:     Mental Status: She is alert and oriented to person, place, and time.     Cranial Nerves: No cranial nerve deficit.     Deep Tendon Reflexes: Reflexes are normal and symmetric.           Assessment & Plan:  Preventative health care Assessment & Plan: Immunizations UTD. Pap smear UTD. Follows with GYN.   Discussed the importance of a healthy diet and regular exercise in order for weight loss, and to reduce the risk of further co-morbidity.  Exam stable. Labs pending.  Follow up in 1 year for repeat physical.   Orders: -     Lipid panel  MTHFR mutation Assessment & Plan: Complications during pregnancy.  Not taking supplements. Adding iron studies, folate, B12, vitamin D today.  Orders: -     CBC -     Comprehensive metabolic panel -     Vitamin B12 -     VITAMIN D 25 Hydroxy (Vit-D Deficiency, Fractures) -     IBC + Ferritin -     Folate  Chronic fatigue Assessment & Plan: Chronic and continued.  Will check extra labs today. She is not on vitamin D or B12 supplements.   Orders: -     CBC -     Vitamin B12 -     VITAMIN D 25 Hydroxy (Vit-D Deficiency, Fractures) -     IBC +  Ferritin -     Folate -     TSH        Doreene Nest, NP

## 2022-10-25 NOTE — Assessment & Plan Note (Signed)
Chronic and continued.  Will check extra labs today. She is not on vitamin D or B12 supplements.

## 2022-10-25 NOTE — Patient Instructions (Signed)
Stop by the lab prior to leaving today. I will notify you of your results once received.   It was a pleasure to see you today!  

## 2022-10-25 NOTE — Assessment & Plan Note (Signed)
Complications during pregnancy.  Not taking supplements. Adding iron studies, folate, B12, vitamin D today.

## 2022-10-25 NOTE — Assessment & Plan Note (Signed)
Immunizations UTD. Pap smear UTD. Follows with GYN.  Discussed the importance of a healthy diet and regular exercise in order for weight loss, and to reduce the risk of further co-morbidity.  Exam stable. Labs pending.  Follow up in 1 year for repeat physical.  

## 2022-10-26 LAB — COMPREHENSIVE METABOLIC PANEL
ALT: 17 U/L (ref 0–35)
AST: 14 U/L (ref 0–37)
Albumin: 4.5 g/dL (ref 3.5–5.2)
Alkaline Phosphatase: 91 U/L (ref 39–117)
BUN: 13 mg/dL (ref 6–23)
CO2: 29 mEq/L (ref 19–32)
Calcium: 9 mg/dL (ref 8.4–10.5)
Chloride: 103 mEq/L (ref 96–112)
Creatinine, Ser: 0.78 mg/dL (ref 0.40–1.20)
GFR: 95.85 mL/min (ref >60.00)
Glucose, Bld: 81 mg/dL (ref 70–99)
Potassium: 4.2 mEq/L (ref 3.5–5.1)
Sodium: 139 mEq/L (ref 135–145)
Total Bilirubin: 0.3 mg/dL (ref 0.2–1.2)
Total Protein: 6.9 g/dL (ref 6.0–8.3)

## 2022-10-26 LAB — IBC + FERRITIN
Ferritin: 41.7 ng/mL (ref 10.0–291.0)
Iron: 109 ug/dL (ref 42–145)
Saturation Ratios: 30.5 % (ref 20.0–50.0)
TIBC: 357 ug/dL (ref 250.0–450.0)
Transferrin: 255 mg/dL (ref 212.0–360.0)

## 2022-10-26 LAB — CBC
HCT: 40.3 % (ref 36.0–46.0)
Hemoglobin: 13.9 g/dL (ref 12.0–15.0)
MCHC: 34.5 g/dL (ref 30.0–36.0)
MCV: 94.1 fl (ref 78.0–100.0)
Platelets: 181 10*3/uL (ref 150.0–400.0)
RBC: 4.28 Mil/uL (ref 3.87–5.11)
RDW: 12.8 % (ref 11.5–15.5)
WBC: 5.9 10*3/uL (ref 4.0–10.5)

## 2022-10-26 LAB — LIPID PANEL
Cholesterol: 141 mg/dL (ref 0–200)
HDL: 64.4 mg/dL (ref >39.00)
LDL Cholesterol: 60 mg/dL (ref 0–99)
NonHDL: 76.39
Total CHOL/HDL Ratio: 2
Triglycerides: 80 mg/dL (ref 0.0–149.0)
VLDL: 16 mg/dL (ref 0.0–40.0)

## 2022-10-26 LAB — VITAMIN B12: Vitamin B-12: 229 pg/mL (ref 211–911)

## 2022-10-26 LAB — FOLATE: Folate: >23.8 ng/mL (ref >5.9)

## 2022-10-26 LAB — TSH: TSH: 1.53 u[IU]/mL (ref 0.35–5.50)

## 2022-10-26 LAB — VITAMIN D 25 HYDROXY (VIT D DEFICIENCY, FRACTURES): VITD: 28.69 ng/mL — ABNORMAL LOW (ref 30.00–100.00)

## 2022-11-01 DIAGNOSIS — H52223 Regular astigmatism, bilateral: Secondary | ICD-10-CM | POA: Diagnosis not present

## 2022-12-04 DIAGNOSIS — Z30432 Encounter for removal of intrauterine contraceptive device: Secondary | ICD-10-CM | POA: Diagnosis not present

## 2022-12-04 DIAGNOSIS — Z01419 Encounter for gynecological examination (general) (routine) without abnormal findings: Secondary | ICD-10-CM | POA: Diagnosis not present

## 2022-12-05 LAB — HM PAP SMEAR: HPV, high-risk: NEGATIVE

## 2023-01-16 ENCOUNTER — Other Ambulatory Visit (HOSPITAL_COMMUNITY): Payer: Self-pay

## 2023-01-16 ENCOUNTER — Telehealth: Payer: 59 | Admitting: Family Medicine

## 2023-01-16 DIAGNOSIS — R3989 Other symptoms and signs involving the genitourinary system: Secondary | ICD-10-CM | POA: Diagnosis not present

## 2023-01-16 MED ORDER — NITROFURANTOIN MONOHYD MACRO 100 MG PO CAPS
100.0000 mg | ORAL_CAPSULE | Freq: Two times a day (BID) | ORAL | 0 refills | Status: AC
  Filled 2023-01-16: qty 10, 5d supply, fill #0

## 2023-01-16 NOTE — Progress Notes (Signed)

## 2023-08-28 ENCOUNTER — Other Ambulatory Visit: Payer: Self-pay | Admitting: Obstetrics and Gynecology

## 2023-08-28 DIAGNOSIS — Z0000 Encounter for general adult medical examination without abnormal findings: Secondary | ICD-10-CM

## 2023-09-03 ENCOUNTER — Ambulatory Visit
Admission: RE | Admit: 2023-09-03 | Discharge: 2023-09-03 | Disposition: A | Discharge status: Home / Self Care | Payer: 59 | Source: Ambulatory Visit | Attending: Obstetrics and Gynecology | Admitting: Obstetrics and Gynecology

## 2023-09-03 DIAGNOSIS — Z0000 Encounter for general adult medical examination without abnormal findings: Secondary | ICD-10-CM

## 2023-09-03 DIAGNOSIS — Z1231 Encounter for screening mammogram for malignant neoplasm of breast: Secondary | ICD-10-CM | POA: Diagnosis not present

## 2023-10-30 ENCOUNTER — Encounter: Payer: Self-pay | Admitting: Primary Care

## 2023-10-30 ENCOUNTER — Ambulatory Visit: Payer: 59 | Admitting: Primary Care

## 2023-10-30 VITALS — BP 108/66 | HR 96 | Temp 97.3°F | Ht 63.5 in | Wt 154.0 lb

## 2023-10-30 DIAGNOSIS — Z13220 Encounter for screening for lipoid disorders: Secondary | ICD-10-CM | POA: Diagnosis not present

## 2023-10-30 DIAGNOSIS — Z0000 Encounter for general adult medical examination without abnormal findings: Secondary | ICD-10-CM

## 2023-10-30 LAB — COMPREHENSIVE METABOLIC PANEL WITH GFR
ALT: 14 U/L (ref 0–35)
AST: 15 U/L (ref 0–37)
Albumin: 4.7 g/dL (ref 3.5–5.2)
Alkaline Phosphatase: 67 U/L (ref 39–117)
BUN: 9 mg/dL (ref 6–23)
CO2: 28 meq/L (ref 19–32)
Calcium: 9.4 mg/dL (ref 8.4–10.5)
Chloride: 102 meq/L (ref 96–112)
Creatinine, Ser: 0.64 mg/dL (ref 0.40–1.20)
GFR: 110.74 mL/min (ref >60.00)
Glucose, Bld: 88 mg/dL (ref 70–99)
Potassium: 4.2 meq/L (ref 3.5–5.1)
Sodium: 137 meq/L (ref 135–145)
Total Bilirubin: 0.3 mg/dL (ref 0.2–1.2)
Total Protein: 7.2 g/dL (ref 6.0–8.3)

## 2023-10-30 LAB — LIPID PANEL
Cholesterol: 132 mg/dL (ref 0–200)
HDL: 59.2 mg/dL (ref >39.00)
LDL Cholesterol: 65 mg/dL (ref 0–99)
NonHDL: 72.86
Total CHOL/HDL Ratio: 2
Triglycerides: 40 mg/dL (ref 0.0–149.0)
VLDL: 8 mg/dL (ref 0.0–40.0)

## 2023-10-30 LAB — CBC
HCT: 42.6 % (ref 36.0–46.0)
Hemoglobin: 14.5 g/dL (ref 12.0–15.0)
MCHC: 33.9 g/dL (ref 30.0–36.0)
MCV: 95.4 fl (ref 78.0–100.0)
Platelets: 193 10*3/uL (ref 150.0–400.0)
RBC: 4.47 Mil/uL (ref 3.87–5.11)
RDW: 12.8 % (ref 11.5–15.5)
WBC: 5.3 10*3/uL (ref 4.0–10.5)

## 2023-10-30 NOTE — Progress Notes (Signed)
 Subjective:    Patient ID: [name], female    DOB: 1985/12/20, 38 y.o.   MRN: [id number]  HPI  [name] is a very pleasant 38 y.o. female who presents today for complete physical and follow up of chronic conditions.  Immunizations: -Tetanus: Completed in 2024  Diet: Fair diet.  Exercise: Regular exercise  Eye exam: Completes annually  Dental exam: Completes semi-annually    Pap Smear: Completed per GYN. Mammogram: Completed in 2025  BP Readings from Last 3 Encounters:  10/30/23 108/66  10/25/22 112/68  07/06/22 (!) 114/90    Wt Readings from Last 3 Encounters:  10/30/23 154 lb (69.9 kg)  10/25/22 153 lb (69.4 kg)  07/06/22 150 lb 6 oz (68.2 kg)      Review of Systems  Constitutional:  Negative for unexpected weight change.  HENT:  Negative for rhinorrhea.   Respiratory:  Negative for cough and shortness of breath.   Cardiovascular:  Negative for chest pain.  Gastrointestinal:  Negative for constipation and diarrhea.  Genitourinary:  Negative for difficulty urinating and menstrual problem.  Musculoskeletal:  Negative for arthralgias and myalgias.  Skin:  Negative for rash.  Allergic/Immunologic: Positive for environmental allergies.  Neurological:  Negative for dizziness and headaches.  Psychiatric/Behavioral:  The patient is not nervous/anxious.          Past Medical History:  Diagnosis Date   Acute heart failure (HCC) 2009   With pregnancy   Chicken pox    History of UTI    Maternal anemia, with delivery 11/27/2011   Other headache syndrome 07/06/2022   PP care - s/p rC/S 5/13 11/26/2011   Pre-eclampsia 2009   first pregnancy   Right upper quadrant abdominal pain 04/19/2016   Strep pharyngitis 07/06/2022   Thrombocytopenia (HCC) 11/27/2011    Social History   Socioeconomic History   Marital status: Married    Spouse name: Not on file   Number of children: Not on file   Years of education: Not on file   Highest education level:  Master's degree (e.g., MA, MS, MEng, MEd, MSW, MBA)  Occupational History   Not on file  Tobacco Use   Smoking status: Never   Smokeless tobacco: Never  Vaping Use   Vaping status: Never Used  Substance and Sexual Activity   Alcohol use: Yes    Comment: socially   Drug use: No   Sexual activity: Yes  Other Topics Concern   Not on file  Social History Narrative   Married.   2 children.   Works as a Engineer, civil (consulting) at [employer].    Enjoys spending time with her family, decorate.   Social Drivers of Corporate investment banker Strain: Low Risk  (10/29/2023)   Overall Financial Resource Strain (CARDIA)    Difficulty of Paying Living Expenses: Not hard at all  Food Insecurity: No Food Insecurity (10/29/2023)   Hunger Vital Sign    Worried About Running Out of Food in the Last Year: Never true    Ran Out of Food in the Last Year: Never true  Transportation Needs: No Transportation Needs (10/29/2023)   PRAPARE - Administrator, Civil Service (Medical): No    Lack of Transportation (Non-Medical): No  Physical Activity: Insufficiently Active (10/29/2023)   Exercise Vital Sign    Days of Exercise per Week: 3 days    Minutes of Exercise per Session: 40 min  Stress: No Stress Concern Present (10/29/2023)   [employer]  of Occupational Health - Occupational Stress Questionnaire    Feeling of Stress : Only a little  Social Connections: Unknown (10/29/2023)   Social Connection and Isolation Panel [NHANES]    Frequency of Communication with Friends and Family: More than three times a week    Frequency of Social Gatherings with Friends and Family: More than three times a week    Attends Religious Services: Patient declined    Active Member of Clubs or Organizations: Yes    Attends Banker Meetings: 1 to 4 times per year    Marital Status: Married  Catering manager Violence: Not on file    Past Surgical History:  Procedure Laterality Date   CESAREAN SECTION      CESAREAN SECTION  11/26/2011   Procedure: CESAREAN SECTION;  Surgeon: Kandra Orn, MD;  Location: [facility];  Service: Gynecology;  Laterality: N/A;  EDD: 11/25/11    Family History  Problem Relation Age of Onset   Breast cancer Mother 42   Melanoma Mother    Melanoma Father    Prostate cancer Father    Hyperlipidemia Father    Hypertension Father    Breast cancer Maternal Grandmother    Breast cancer Paternal Grandmother    Diabetes Paternal Grandmother    Diabetes Paternal Grandfather     Allergies  Allergen Reactions   Penicillins Hives    Childhood reaction    Current Outpatient Medications on File Prior to Visit  Medication Sig Dispense Refill   acetaminophen (TYLENOL) 325 MG tablet Take 650 mg by mouth daily as needed. headache     cetirizine (ZYRTEC) 10 MG tablet Take 10 mg by mouth daily.     ibuprofen (ADVIL,MOTRIN) 200 MG tablet Take 200 mg by mouth every 6 (six) hours as needed.     No current facility-administered medications on file prior to visit.    BP 108/66   Pulse 96   Temp (!) 97.3 F (36.3 C) (Temporal)   Ht 5' 3.5" (1.613 m)   Wt 154 lb (69.9 kg)   LMP 10/09/2023   SpO2 99%   BMI 26.85 kg/m  Objective:   Physical Exam HENT:     Right Ear: Tympanic membrane and ear canal normal.     Left Ear: Tympanic membrane and ear canal normal.  Eyes:     Pupils: Pupils are equal, round, and reactive to light.  Cardiovascular:     Rate and Rhythm: Normal rate and regular rhythm.  Pulmonary:     Effort: Pulmonary effort is normal.     Breath sounds: Normal breath sounds.  Abdominal:     General: Bowel sounds are normal.     Palpations: Abdomen is soft.     Tenderness: There is no abdominal tenderness.  Musculoskeletal:        General: Normal range of motion.     Cervical back: Neck supple.  Skin:    General: Skin is warm and dry.  Neurological:     Mental Status: She is alert and oriented to person, place, and time.     Cranial Nerves: No  cranial nerve deficit.     Deep Tendon Reflexes:     Reflex Scores:      Patellar reflexes are 2+ on the right side and 2+ on the left side. Psychiatric:        Mood and Affect: Mood normal.           Assessment & Plan:  Preventative health care Assessment &  Plan: Immunizations UTD. Pap smear UTD. Follows with GYN Mammogram UTD.  Discussed the importance of a healthy diet and regular exercise in order for weight loss, and to reduce the risk of further co-morbidity.  Exam stable. Labs pending.  Follow up in 1 year for repeat physical.   Orders: -     Lipid panel -     Comprehensive metabolic panel with GFR -     CBC        Gabriel John, NP

## 2023-10-30 NOTE — Patient Instructions (Signed)
 Stop by the lab prior to leaving today. I will notify you of your results once received.   It was a pleasure to see you today!

## 2023-10-30 NOTE — Assessment & Plan Note (Signed)
Immunizations UTD. Pap smear UTD. Follows with GYN Mammogram UTD  Discussed the importance of a healthy diet and regular exercise in order for weight loss, and to reduce the risk of further co-morbidity.  Exam stable. Labs pending.  Follow up in 1 year for repeat physical.

## 2023-11-28 DIAGNOSIS — H52223 Regular astigmatism, bilateral: Secondary | ICD-10-CM | POA: Diagnosis not present

## 2023-12-05 DIAGNOSIS — Z01419 Encounter for gynecological examination (general) (routine) without abnormal findings: Secondary | ICD-10-CM | POA: Diagnosis not present

## 2024-02-05 ENCOUNTER — Other Ambulatory Visit (HOSPITAL_COMMUNITY): Payer: Self-pay

## 2024-02-05 ENCOUNTER — Telehealth: Admitting: Physician Assistant

## 2024-02-05 DIAGNOSIS — R3989 Other symptoms and signs involving the genitourinary system: Secondary | ICD-10-CM | POA: Diagnosis not present

## 2024-02-05 MED ORDER — NITROFURANTOIN MONOHYD MACRO 100 MG PO CAPS
100.0000 mg | ORAL_CAPSULE | Freq: Two times a day (BID) | ORAL | 0 refills | Status: AC
Start: 2024-02-05 — End: ?
  Filled 2024-02-05: qty 10, 5d supply, fill #0

## 2024-02-05 NOTE — Progress Notes (Signed)

## 2024-10-30 ENCOUNTER — Encounter: Admitting: Primary Care
